# Patient Record
Sex: Female | Born: 1970 | Hispanic: Yes | Smoking: Never smoker
Health system: Southern US, Community
[De-identification: ages and names within clinical notes are randomized; demographics above are authoritative.]

## PROBLEM LIST (undated history)

## (undated) DIAGNOSIS — F319 Bipolar disorder, unspecified: Secondary | ICD-10-CM

## (undated) DIAGNOSIS — F209 Schizophrenia, unspecified: Secondary | ICD-10-CM

## (undated) DIAGNOSIS — I10 Essential (primary) hypertension: Secondary | ICD-10-CM

## (undated) DIAGNOSIS — Z5189 Encounter for other specified aftercare: Secondary | ICD-10-CM

## (undated) DIAGNOSIS — E119 Type 2 diabetes mellitus without complications: Secondary | ICD-10-CM

## (undated) DIAGNOSIS — O039 Complete or unspecified spontaneous abortion without complication: Secondary | ICD-10-CM

## (undated) DIAGNOSIS — K5792 Diverticulitis of intestine, part unspecified, without perforation or abscess without bleeding: Secondary | ICD-10-CM

## (undated) HISTORY — PX: TUBAL LIGATION: SHX77

## (undated) HISTORY — PX: TUMOR REMOVAL: SHX12

---

## 2018-12-29 ENCOUNTER — Other Ambulatory Visit: Payer: Self-pay

## 2018-12-29 ENCOUNTER — Encounter (HOSPITAL_COMMUNITY): Payer: Self-pay | Admitting: *Deleted

## 2018-12-29 ENCOUNTER — Emergency Department (HOSPITAL_COMMUNITY)
Admission: EM | Admit: 2018-12-29 | Discharge: 2018-12-29 | Disposition: A | Discharge status: Home / Self Care | Payer: Medicaid Other | Attending: Emergency Medicine | Admitting: Emergency Medicine

## 2018-12-29 DIAGNOSIS — R1031 Right lower quadrant pain: Secondary | ICD-10-CM | POA: Insufficient documentation

## 2018-12-29 DIAGNOSIS — Z5321 Procedure and treatment not carried out due to patient leaving prior to being seen by health care provider: Secondary | ICD-10-CM | POA: Insufficient documentation

## 2018-12-29 HISTORY — DX: Schizophrenia, unspecified: F20.9

## 2018-12-29 HISTORY — DX: Essential (primary) hypertension: I10

## 2018-12-29 HISTORY — DX: Bipolar disorder, unspecified: F31.9

## 2018-12-29 HISTORY — DX: Encounter for other specified aftercare: Z51.89

## 2018-12-29 HISTORY — DX: Type 2 diabetes mellitus without complications: E11.9

## 2018-12-29 HISTORY — DX: Complete or unspecified spontaneous abortion without complication: O03.9

## 2018-12-29 LAB — COMPREHENSIVE METABOLIC PANEL WITH GFR
ALT: 20 U/L (ref 0–44)
AST: 19 U/L (ref 15–41)
Albumin: 3.9 g/dL (ref 3.5–5.0)
Alkaline Phosphatase: 78 U/L (ref 38–126)
Anion gap: 12 (ref 5–15)
BUN: 10 mg/dL (ref 6–20)
CO2: 27 mmol/L (ref 22–32)
Calcium: 9.4 mg/dL (ref 8.9–10.3)
Chloride: 100 mmol/L (ref 98–111)
Creatinine, Ser: 0.75 mg/dL (ref 0.44–1.00)
GFR calc Af Amer: >60 mL/min
GFR calc non Af Amer: >60 mL/min
Glucose, Bld: 162 mg/dL — ABNORMAL HIGH (ref 70–99)
Potassium: 4.2 mmol/L (ref 3.5–5.1)
Sodium: 139 mmol/L (ref 135–145)
Total Bilirubin: 0.3 mg/dL (ref 0.3–1.2)
Total Protein: 7.8 g/dL (ref 6.5–8.1)

## 2018-12-29 LAB — CBC
HCT: 42.3 % (ref 36.0–46.0)
Hemoglobin: 13.2 g/dL (ref 12.0–15.0)
MCH: 29.7 pg (ref 26.0–34.0)
MCHC: 31.2 g/dL (ref 30.0–36.0)
MCV: 95.1 fL (ref 80.0–100.0)
Platelets: 352 K/uL (ref 150–400)
RBC: 4.45 MIL/uL (ref 3.87–5.11)
RDW: 14.7 % (ref 11.5–15.5)
WBC: 10.1 K/uL (ref 4.0–10.5)
nRBC: 0 % (ref 0.0–0.2)

## 2018-12-29 LAB — LIPASE, BLOOD: Lipase: 29 U/L (ref 11–51)

## 2018-12-29 NOTE — ED Triage Notes (Signed)
Pt c/o right sided abdominal pain that started last night. Denies n/v/d. Pt reports she had an xray done in [place] about a week ago and was told she had uterine cancer and needed to follow up with a doctor.

## 2019-01-18 ENCOUNTER — Telehealth: Payer: Self-pay | Admitting: Obstetrics and Gynecology

## 2019-01-18 NOTE — Telephone Encounter (Signed)
Called patient regarding appointment scheduled in our office and advised to come alone to the visits, however, a support person, over age 50, may accompany her  to appointment if assistance is needed for safety or care concerns. Otherwise, support persons should remain outside until the visit is complete.   We ask if you have had any exposure to anyone suspected or confirmed of having COVID-19, are awaiting test results for COVID-19 or if you are experiencing any of the following, to call and reschedule your appointment: fever, cough, shortness of breath, muscle pain, diarrhea, rash, vomiting, abdominal pain, red eye, weakness, bruising, bleeding, joint pain, or a severe headache.   Please know we will ask you these questions or similar questions when you arrive for your appointment and again it's how we are keeping everyone safe.    Also,to keep you safe, please use the provided hand sanitizer when you enter the office. We are asking everyone in the office to wear a mask to help prevent the spread of germs. If you have a mask of your own, please wear it to your appointment, if not, we are happy to provide one for you.  Thank you for understanding and your cooperation.    [facility] Staff    

## 2019-01-19 ENCOUNTER — Other Ambulatory Visit: Payer: Self-pay | Admitting: Obstetrics and Gynecology

## 2019-01-19 ENCOUNTER — Encounter: Payer: Self-pay | Admitting: Obstetrics and Gynecology

## 2019-01-19 ENCOUNTER — Other Ambulatory Visit: Payer: Self-pay

## 2019-01-19 ENCOUNTER — Ambulatory Visit (INDEPENDENT_AMBULATORY_CARE_PROVIDER_SITE_OTHER): Payer: Medicaid Other | Admitting: Obstetrics and Gynecology

## 2019-01-19 VITALS — BP 139/105 | HR 85 | Ht 63.0 in | Wt 229.4 lb

## 2019-01-19 DIAGNOSIS — N840 Polyp of corpus uteri: Secondary | ICD-10-CM

## 2019-01-19 DIAGNOSIS — N950 Postmenopausal bleeding: Secondary | ICD-10-CM

## 2019-01-19 NOTE — Progress Notes (Addendum)
Patient ID: Jaclyn Butler, female   DOB: [birth year], 48 y.o.   MRN: [id number]  Jaclyn Butler is a 48 y.o. female [place] presenting today for post-menopausal bleeding. She woke up one morning last month and her underwear was soaked with dark red blood. After discussing her options, the pt decided that she would like to proceed with an endometrial biopsy today.  Endometrial Biopsy: Patient given informed consent, signed copy in the chart, time out was performed. Time out taken. The patient was placed in the lithotomy position and the cervix brought into view with sterile speculum.  Portio of cervix cleansed x 2 with betadine swabs.  A tenaculum was placed in the anterior lip of the cervix. The uterus was sounded for depth of 9.5 cm,. Milex uterine Explora 3 mm was introduced to into the uterus, suction created,  and an endometrial sample was obtained. All equipment was removed and accounted for.   The patient tolerated the procedure well.   Patient given post procedure instructions.  Followup: with results by phone  By signing my name below, I, De Burrs, attest that this documentation has been prepared under the direction and in the presence of Jonnie Kind, MD. Electronically Signed: De Burrs, Medical Scribe. 01/19/19. 12:21 PM.  I personally performed the services described in this documentation, which was SCRIBED in my presence. The recorded information has been reviewed and considered accurate. It has been edited as necessary during review. Jonnie Kind, MD

## 2019-01-20 ENCOUNTER — Telehealth: Payer: Self-pay | Admitting: Obstetrics and Gynecology

## 2019-01-20 NOTE — Telephone Encounter (Signed)
PT MADE aware of endometrial biopsy showing benign polypoid tissue. Pt reassured. Pt to keep mensrual calendayr. If bleegin persisits, pt to be reviewing Endobmetrial ablation as an option.

## 2020-06-12 ENCOUNTER — Emergency Department (HOSPITAL_COMMUNITY)
Admission: EM | Admit: 2020-06-12 | Discharge: 2020-06-13 | Disposition: A | Discharge status: Home / Self Care | Payer: Medicaid Other | Attending: Emergency Medicine | Admitting: Emergency Medicine

## 2020-06-12 ENCOUNTER — Other Ambulatory Visit: Payer: Self-pay

## 2020-06-12 ENCOUNTER — Encounter (HOSPITAL_COMMUNITY): Payer: Self-pay | Admitting: *Deleted

## 2020-06-12 DIAGNOSIS — R0789 Other chest pain: Secondary | ICD-10-CM | POA: Insufficient documentation

## 2020-06-12 DIAGNOSIS — Z7984 Long term (current) use of oral hypoglycemic drugs: Secondary | ICD-10-CM | POA: Insufficient documentation

## 2020-06-12 DIAGNOSIS — Z79899 Other long term (current) drug therapy: Secondary | ICD-10-CM | POA: Insufficient documentation

## 2020-06-12 DIAGNOSIS — R202 Paresthesia of skin: Secondary | ICD-10-CM | POA: Insufficient documentation

## 2020-06-12 DIAGNOSIS — R519 Headache, unspecified: Secondary | ICD-10-CM

## 2020-06-12 DIAGNOSIS — F419 Anxiety disorder, unspecified: Secondary | ICD-10-CM | POA: Insufficient documentation

## 2020-06-12 DIAGNOSIS — I10 Essential (primary) hypertension: Secondary | ICD-10-CM | POA: Insufficient documentation

## 2020-06-12 DIAGNOSIS — E119 Type 2 diabetes mellitus without complications: Secondary | ICD-10-CM | POA: Insufficient documentation

## 2020-06-12 MED ORDER — SODIUM CHLORIDE 0.9 % IV BOLUS
1000.0000 mL | Freq: Once | INTRAVENOUS | Status: AC
  Administered 2020-06-12: 1000 mL via INTRAVENOUS

## 2020-06-12 MED ORDER — PROCHLORPERAZINE EDISYLATE 10 MG/2ML IJ SOLN
10.0000 mg | Freq: Once | INTRAMUSCULAR | Status: AC
  Administered 2020-06-12: 10 mg via INTRAVENOUS
  Filled 2020-06-12: qty 2

## 2020-06-12 MED ORDER — DIPHENHYDRAMINE HCL 50 MG/ML IJ SOLN
25.0000 mg | Freq: Once | INTRAMUSCULAR | Status: AC
  Administered 2020-06-12: 25 mg via INTRAVENOUS
  Filled 2020-06-12: qty 1

## 2020-06-12 NOTE — ED Triage Notes (Signed)
Pt with right face numbness, tingling to fingers of right hand and left sided CP since this morning.  Stabbing HA as well. Noted around 1000 this morning per pt.

## 2020-06-12 NOTE — ED Provider Notes (Signed)
[facility] EMERGENCY DEPARTMENT Provider Note   CSN: [id number] Arrival date & time: 06/12/20  2109     History Chief Complaint  Patient presents with  . Headache    Jaclyn Butler is a 50 y.o. female.  HPI   Patient is a 50 year old female, she reports a history of bipolar disorder schizophrenia and diabetes for which she takes a combination of Seroquel and metformin.  She denies tobacco or alcohol use.  She was in her usual state of health until this morning around 10:00 in the morning when she developed a gradual onset of a sharp and stabbing pain that is over the right temporal area.  This has not caused any visual changes, it seems to be intermittent, comes and goes, lasts for a few seconds and then goes away, it is associated with a feeling of anxiety, a discomfort in the left side of the chest and tingling in the right fingers.  She does not have any left-sided symptoms, she is not short of breath or coughing or having fevers chills nausea or vomiting.  She has not had any medications for this prior to arrival.  She does report a history of allergy to naproxen.  Past Medical History:  Diagnosis Date  . Bipolar disorder (HCC)   . Blood transfusion without reported diagnosis   . Diabetes mellitus without complication (HCC)   . Hypertension   . Miscarriage   . Schizophrenia (HCC)     There are no problems to display for this patient.   Past Surgical History:  Procedure Laterality Date  . TUBAL LIGATION    . 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 Cirrhosis Father   . Diabetes Mother   . Diabetes Sister   . Blindness Sister   . Hypertension Sister   . Diabetes Sister   . Diabetes Daughter   . Hypertension Daughter    . Obesity Daughter     Social History   Tobacco Use  . Smoking status: Never Smoker  . Smokeless tobacco: Never Used  Vaping Use  . Vaping Use: Never used  Substance Use Topics  . Alcohol use: Not Currently  . Drug use: Not Currently    Types: Cocaine    Comment: clean for several years    Home Medications Prior to Admission medications   Medication Sig Start Date End Date Taking? Authorizing Provider  atorvastatin (LIPITOR) 20 MG tablet Take 20 mg by mouth daily.    [provider]  metFORMIN (GLUCOPHAGE) 500 MG tablet Take 500 mg by mouth 2 (two) times daily with a meal.    [provider]  QUEtiapine (SEROQUEL) 200 MG tablet Take 200 mg by mouth at bedtime.    [provider]    Allergies    Naproxen  Review of Systems   Review of Systems  All other systems reviewed and are negative.   Physical Exam Updated Vital Signs BP 129/79  Pulse 81   Temp 98.4 F (36.9 C) (Oral)   Resp (!) 24   Ht 1.6 m (5\' 3" )   Wt 102.1 kg   SpO2 100%   BMI 39.86 kg/m   Physical Exam Vitals and nursing note reviewed.  Constitutional:      General: She is not in acute distress.    Appearance: She is well-developed.  HENT:     Head: Normocephalic and atraumatic.     Mouth/Throat:     Pharynx: No oropharyngeal exudate.  Eyes:     General: No scleral icterus.       Right eye: No discharge.        Left eye: No discharge.     Conjunctiva/sclera: Conjunctivae normal.     Pupils: Pupils are equal, round, and reactive to light.  Neck:     Thyroid: No thyromegaly.     Vascular: No JVD.  Cardiovascular:     Rate and Rhythm: Normal rate and regular rhythm.     Heart sounds: Normal heart sounds. No murmur heard. No friction rub. No gallop.   Pulmonary:     Effort: Pulmonary effort is normal. No respiratory distress.     Breath sounds: Normal breath sounds. No wheezing or rales.  Abdominal:     General: Bowel sounds are normal. There is no distension.      Palpations: Abdomen is soft. There is no mass.     Tenderness: There is no abdominal tenderness.  Musculoskeletal:        General: No tenderness. Normal range of motion.     Cervical back: Normal range of motion and neck supple.  Lymphadenopathy:     Cervical: No cervical adenopathy.  Skin:    General: Skin is warm and dry.     Findings: No erythema or rash.  Neurological:     Mental Status: She is alert.     Coordination: Coordination normal.     Comments: The patient is able to speak in full sentences, she has normal mental status, she is able to follow commands without any difficulty including finger-nose-finger, she has normal strength in all 4 extremities, cranial nerves III through XII are normal, speech is clear, mentation is clear, coordination is exact, gait is normal, facial symmetry is normal, straight leg raise is normal, sensation is normal except for slight tingling in her right hand.  Psychiatric:        Behavior: Behavior normal.     ED Results / Procedures / Treatments   Labs (all labs ordered are listed, but only abnormal results are displayed) Labs Reviewed - No data to display  EKG EKG Interpretation  Date/Time:  Tuesday Jun 12 2020 21:23:14 EDT Ventricular Rate:  88 PR Interval:  162 QRS Duration: 90 QT Interval:  384 QTC Calculation: 464 R Axis:   -68 Text Interpretation: Normal sinus rhythm Left axis deviation Cannot rule out Anterior infarct , age undetermined Abnormal ECG Confirmed by [id number] (Eber Hong) on 06/12/2020 9:43:13 PM   Radiology No results found.  Procedures Procedures   Medications Ordered in ED Medications  prochlorperazine (COMPAZINE) injection 10 mg (10 mg Intravenous Given 06/12/20 2332)  diphenhydrAMINE (BENADRYL) injection 25 mg (25 mg Intravenous Given 06/12/20 2332)  sodium chloride 0.9 % bolus 1,000 mL (1,000 mLs Intravenous New Bag/Given 06/12/20 2330)    ED Course  I have reviewed the triage vital signs and the  nursing notes.  Pertinent labs & imaging results that were available during my care of the  patient were reviewed by me and considered in my medical decision making (see chart for details).    MDM Rules/Calculators/A&P                          The patient overall appears well, vital signs suggest a slight hypertension at 141/88 but she is afebrile, her EKG is unremarkable and in fact I would say it is rather normal.  She does have a left axis deviation but no signs of ST elevation or depression, no signs of T wave abnormalities.  We will give her some medication for the headache which does not seem pathological, it is not constant, severe, it did not come on acutely, it is not the worst headache of her life and she is not anticoagulated.  She has a normal neurologic exam except for slight tingling in her right hand at this time  And change of shift care signed out to oncoming physician to follow-up results and disposition accordingly  Final Clinical Impression(s) / ED Diagnoses Final diagnoses:  None    Rx / DC Orders ED Discharge Orders    None       Eber Hong, MD 06/12/20 2337

## 2020-06-14 ENCOUNTER — Emergency Department (HOSPITAL_COMMUNITY): Payer: Self-pay

## 2020-06-14 ENCOUNTER — Encounter (HOSPITAL_COMMUNITY): Payer: Self-pay | Admitting: Emergency Medicine

## 2020-06-14 ENCOUNTER — Emergency Department (HOSPITAL_COMMUNITY)
Admission: EM | Admit: 2020-06-14 | Discharge: 2020-06-14 | Disposition: A | Discharge status: Home / Self Care | Payer: Self-pay | Attending: Emergency Medicine | Admitting: Emergency Medicine

## 2020-06-14 ENCOUNTER — Other Ambulatory Visit: Payer: Self-pay

## 2020-06-14 DIAGNOSIS — R519 Headache, unspecified: Secondary | ICD-10-CM | POA: Insufficient documentation

## 2020-06-14 DIAGNOSIS — Z20822 Contact with and (suspected) exposure to covid-19: Secondary | ICD-10-CM | POA: Insufficient documentation

## 2020-06-14 DIAGNOSIS — E119 Type 2 diabetes mellitus without complications: Secondary | ICD-10-CM | POA: Insufficient documentation

## 2020-06-14 DIAGNOSIS — Z7984 Long term (current) use of oral hypoglycemic drugs: Secondary | ICD-10-CM | POA: Insufficient documentation

## 2020-06-14 DIAGNOSIS — I10 Essential (primary) hypertension: Secondary | ICD-10-CM | POA: Insufficient documentation

## 2020-06-14 LAB — CBC WITH DIFFERENTIAL/PLATELET
Abs Immature Granulocytes: 0.03 10*3/uL (ref 0.00–0.07)
Basophils Absolute: 0 10*3/uL (ref 0.0–0.1)
Basophils Relative: 0 %
Eosinophils Absolute: 0.1 10*3/uL (ref 0.0–0.5)
Eosinophils Relative: 1 %
HCT: 40.8 % (ref 36.0–46.0)
Hemoglobin: 13 g/dL (ref 12.0–15.0)
Immature Granulocytes: 0 %
Lymphocytes Relative: 23 %
Lymphs Abs: 2.4 10*3/uL (ref 0.7–4.0)
MCH: 30.1 pg (ref 26.0–34.0)
MCHC: 31.9 g/dL (ref 30.0–36.0)
MCV: 94.4 fL (ref 80.0–100.0)
Monocytes Absolute: 0.5 10*3/uL (ref 0.1–1.0)
Monocytes Relative: 5 %
Neutro Abs: 7.7 10*3/uL (ref 1.7–7.7)
Neutrophils Relative %: 71 %
Platelets: 294 10*3/uL (ref 150–400)
RBC: 4.32 MIL/uL (ref 3.87–5.11)
RDW: 14.5 % (ref 11.5–15.5)
WBC: 10.8 10*3/uL — ABNORMAL HIGH (ref 4.0–10.5)
nRBC: 0 % (ref 0.0–0.2)

## 2020-06-14 LAB — BASIC METABOLIC PANEL
Anion gap: 5 (ref 5–15)
BUN: 15 mg/dL (ref 6–20)
CO2: 30 mmol/L (ref 22–32)
Calcium: 9.2 mg/dL (ref 8.9–10.3)
Chloride: 100 mmol/L (ref 98–111)
Creatinine, Ser: 0.75 mg/dL (ref 0.44–1.00)
GFR, Estimated: >60 mL/min (ref >60)
Glucose, Bld: 154 mg/dL — ABNORMAL HIGH (ref 70–99)
Potassium: 4.3 mmol/L (ref 3.5–5.1)
Sodium: 135 mmol/L (ref 135–145)

## 2020-06-14 LAB — MAGNESIUM: Magnesium: 1.6 mg/dL — ABNORMAL LOW (ref 1.7–2.4)

## 2020-06-14 LAB — URINALYSIS, ROUTINE W REFLEX MICROSCOPIC
Bilirubin Urine: NEGATIVE
Glucose, UA: NEGATIVE mg/dL
Hgb urine dipstick: NEGATIVE
Ketones, ur: NEGATIVE mg/dL
Leukocytes,Ua: NEGATIVE
Nitrite: NEGATIVE
Protein, ur: NEGATIVE mg/dL
Specific Gravity, Urine: 1.01 (ref 1.005–1.030)
pH: 6 (ref 5.0–8.0)

## 2020-06-14 LAB — SEDIMENTATION RATE: Sed Rate: 35 mm/hr — ABNORMAL HIGH (ref 0–22)

## 2020-06-14 LAB — CBG MONITORING, ED: Glucose-Capillary: 166 mg/dL — ABNORMAL HIGH (ref 70–99)

## 2020-06-14 LAB — C-REACTIVE PROTEIN: CRP: 1.2 mg/dL — ABNORMAL HIGH (ref <1.0)

## 2020-06-14 MED ORDER — DEXAMETHASONE SODIUM PHOSPHATE 10 MG/ML IJ SOLN
8.0000 mg | Freq: Once | INTRAMUSCULAR | Status: AC
  Administered 2020-06-14: 8 mg via INTRAVENOUS
  Filled 2020-06-14: qty 1

## 2020-06-14 MED ORDER — SODIUM CHLORIDE 0.9 % IV BOLUS
500.0000 mL | Freq: Once | INTRAVENOUS | Status: AC
  Administered 2020-06-14: 500 mL via INTRAVENOUS

## 2020-06-14 MED ORDER — METOCLOPRAMIDE HCL 5 MG/ML IJ SOLN
10.0000 mg | Freq: Once | INTRAMUSCULAR | Status: AC
  Administered 2020-06-14: 10 mg via INTRAVENOUS
  Filled 2020-06-14: qty 2

## 2020-06-14 MED ORDER — DEXAMETHASONE SODIUM PHOSPHATE 4 MG/ML IJ SOLN: 4.0000 mg | Freq: Once | INTRAMUSCULAR | Status: DC

## 2020-06-14 NOTE — ED Triage Notes (Signed)
Pt c/o "stabbing pains" to right side of head x 30 mins, reports was feeling clammy but that has subsided; pt reports hx bipolar, schizophrenia, depression and anxiety-she's unsure if it may be related

## 2020-06-14 NOTE — Discharge Instructions (Addendum)
There were very mild elevations in the ESR and CRP.  These are nonspecific, meaning they do not point to any one thing.  Have these retested through your primary care provider.  Similarly, your magnesium was slightly low.  Have this retested as well.  Headache Your lab results showed no other abnormalities that would require action.  For future headaches please try the following regimen: Antiinflammatory medications: Take 600 mg of ibuprofen every 6 hours or 440 mg (over the counter dose) to 500 mg (prescription dose) of naproxen every 12 hours.  Use this regimen until headache subsides for up to 3 days .  After this time, these medications may be used as needed for pain. Take these medications with food to avoid upset stomach. Choose only one of these medications, do not take them together. Acetaminophen: Should you continue to have additional pain while taking the ibuprofen or naproxen, you may add in acetaminophen (generic for Tylenol) as needed. Your daily total maximum amount of acetaminophen from all sources should be limited to 4046m/day for persons without liver problems, or 20071mday for those with liver problems.  Hydration: Have a goal of about a half liter of water every couple hours to stay well hydrated.   Sleep: Please be sure to get plenty of sleep with a goal of 8 hours per night. Having a regular bed time and bedtime routine can help with this.  Screens: Reduce the amount of time you are in front of screens.  Take about a 5-10-minute break every hour or every couple hours to give your eyes rest.  Do not use screens in dark rooms.  Glasses with a blue light filter may also help reduce eye fatigue.  Stress: Take steps to reduce stress as much as possible.   Follow up: Follow-up with your primary care provider on this issue.  May also need to follow-up with the neurologist for increased frequency of headaches.  Test Results for COVID-19 pending  You have a test pending for  COVID-19.  Results typically return within about 48 hours.  Be sure to check MyChart for updated results.  We recommend isolating yourself until results are received.  Patients who have symptoms consistent with COVID-19 should self isolated for: At least 3 days (72 hours) have passed since recovery, defined as resolution of fever without the use of fever reducing medications and improvement in respiratory symptoms (e.g., cough, shortness of breath), and At least 7 days have passed since symptoms first appeared.  If you have no symptoms, but your test returns positive, recommend isolating for at least 10 days.

## 2020-06-14 NOTE — ED Provider Notes (Signed)
[facility] EMERGENCY DEPARTMENT Provider Note   CSN: [id number] Arrival date & time: 06/14/20  1229     History Chief Complaint  Patient presents with  . Headache    Jaclyn Butler is a 50 y.o. female.  HPI      Jaclyn Butler is a 50 y.o. female, with a history of bipolar, DM, HTN, schizophrenia, presenting to the ED with headache beginning shortly prior to arrival. Patient states, "Something was happening with my sister and we had to call EMS to bring her to the emergency room.  I started to have a headache when I was worried about her."  States her pain is to the right side of the head, all the way across the right side, nonradiating from this location, initially 10/10, currently 7/10, sharp.  This pain is similar to her headache she experienced a couple days ago which brought her to the ED.  Denies changes in medications.  She states she has been compliant with her prescribed medications.  Denies anticoagulation. Denies fever, recent illness, fall/trauma, dizziness, vision changes, facial droop, speech deficit, numbness, weakness, neck stiffness, nausea/vomiting, or any other complaints.   Past Medical History:  Diagnosis Date  . Bipolar disorder ([place])   . Blood transfusion without reported diagnosis   . Diabetes mellitus without complication ([place])   . Hypertension   . Miscarriage   . Schizophrenia ([place])     There are no problems to display for this patient.   Past Surgical History:  Procedure Laterality Date  . TUBAL LIGATION    . 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 Cirrhosis Father   . Diabetes Mother   . Diabetes Sister   . Blindness Sister   . Hypertension Sister   . Diabetes Sister   . Diabetes  Daughter   . Hypertension Daughter   . Obesity Daughter     Social History   Tobacco Use  . Smoking status: Never Smoker  . Smokeless tobacco: Never Used  Vaping Use  . Vaping Use: Never used  Substance Use Topics  . Alcohol use: Not Currently  . Drug use: Not Currently    Types: Cocaine    Comment: clean for several years    Home Medications Prior to Admission medications   Medication Sig Start Date End Date Taking? Authorizing Provider  atorvastatin (LIPITOR) 20 MG tablet Take 20 mg by mouth daily.    [provider]  metFORMIN (GLUCOPHAGE) 500 MG tablet Take 500 mg by mouth 2 (two) times daily with a meal.    [provider]  QUEtiapine (SEROQUEL) 200 MG tablet Take 200 mg by mouth at bedtime.    [provider]    Allergies    Naproxen  Review of Systems   Review of Systems  Constitutional: Negative for chills, diaphoresis and fever.  Respiratory: Negative for shortness of breath.   Cardiovascular: Negative for chest pain.  Gastrointestinal: Negative for abdominal  pain, nausea and vomiting.  Musculoskeletal: Negative for neck stiffness.  Neurological: Positive for headaches. Negative for dizziness, seizures, syncope, facial asymmetry, speech difficulty, weakness, light-headedness and numbness.  All other systems reviewed and are negative.   Physical Exam Updated Vital Signs BP (!) 133/93 (BP Location: Left Arm)   Pulse 81   Temp 98.3 F (36.8 C) (Oral)   Resp 16   Ht 5' 3"  (1.6 m)   Wt 102.1 kg   SpO2 96%   BMI 39.86 kg/m   Physical Exam Vitals and nursing note reviewed.  Constitutional:      General: She is not in acute distress.    Appearance: She is well-developed. She is not diaphoretic.  HENT:     Head: Normocephalic and atraumatic.     Comments: No tenderness to the right side of the head, specifically no tenderness, swelling, or color change to the temple.    Mouth/Throat:     Mouth: Mucous membranes are moist.      Pharynx: Oropharynx is clear.  Eyes:     Conjunctiva/sclera: Conjunctivae normal.  Cardiovascular:     Rate and Rhythm: Normal rate and regular rhythm.     Pulses: Normal pulses.          Radial pulses are 2+ on the right side and 2+ on the left side.       Posterior tibial pulses are 2+ on the right side and 2+ on the left side.     Heart sounds: Normal heart sounds.     Comments: Tactile temperature in the extremities appropriate and equal bilaterally. Pulmonary:     Effort: Pulmonary effort is normal. No respiratory distress.     Breath sounds: Normal breath sounds.  Abdominal:     Palpations: Abdomen is soft.     Tenderness: There is no abdominal tenderness. There is no guarding.  Musculoskeletal:     Cervical back: Neck supple.     Right lower leg: No edema.     Left lower leg: No edema.  Lymphadenopathy:     Cervical: No cervical adenopathy.  Skin:    General: Skin is warm and dry.  Neurological:     Mental Status: She is alert.     Comments: No noted acute cognitive deficit. Sensation grossly intact to light touch in the extremities.   Grip strengths equal bilaterally.   Strength 5/5 in all extremities.  No gait disturbance.  Coordination intact.  Cranial nerves III-XII grossly intact.  Handles oral secretions without noted difficulty.  No noted phonation or speech deficit. No facial droop.   Psychiatric:        Mood and Affect: Mood and affect normal.        Speech: Speech normal.        Behavior: Behavior normal.     ED Results / Procedures / Treatments   Labs (all labs ordered are listed, but only abnormal results are displayed) Labs Reviewed  URINALYSIS, ROUTINE W REFLEX MICROSCOPIC - Abnormal; Notable for the following components:      Result Value   Color, Urine STRAW (*)    All other components within normal limits  BASIC METABOLIC PANEL - Abnormal; Notable for the following components:   Glucose, Bld 154 (*)    All other components within normal  limits  CBC WITH DIFFERENTIAL/PLATELET - Abnormal; Notable for the following components:   WBC 10.8 (*)    All other components within normal limits  MAGNESIUM - Abnormal; Notable for the following  components:   Magnesium 1.6 (*)    All other components within normal limits  C-REACTIVE PROTEIN - Abnormal; Notable for the following components:   CRP 1.2 (*)    All other components within normal limits  SEDIMENTATION RATE - Abnormal; Notable for the following components:   Sed Rate 35 (*)    All other components within normal limits  CBG MONITORING, ED - Abnormal; Notable for the following components:   Glucose-Capillary 166 (*)    All other components within normal limits  SARS CORONAVIRUS 2 (TAT 6-24 HRS)   WBC  Date Value Ref Range Status  06/14/2020 10.8 (H) 4.0 - 10.5 K/uL Final  12/29/2018 10.1 4.0 - 10.5 K/uL Final     EKG None  Radiology CT Head Wo Contrast  Result Date: 06/14/2020 CLINICAL DATA:  Right-sided headache. EXAM: CT HEAD WITHOUT CONTRAST TECHNIQUE: Contiguous axial images were obtained from the base of the skull through the vertex without intravenous contrast. COMPARISON:  None. FINDINGS: Brain: There is no evidence of an acute infarct, intracranial hemorrhage, mass, midline shift, or extra-axial fluid collection. The ventricles and sulci are normal. Vascular: No hyperdense vessel. Skull: No fracture or suspicious osseous lesion. Sinuses/Orbits: Visualized paranasal sinuses and mastoid air cells are clear. Unremarkable orbits. Other: None. IMPRESSION: Negative head CT. Electronically Signed   By: Logan Bores M.D.   On: 06/14/2020 15:11    Procedures Procedures   Medications Ordered in ED Medications  metoCLOPramide (REGLAN) injection 10 mg (10 mg Intravenous Given 06/14/20 1419)  sodium chloride 0.9 % bolus 500 mL (0 mLs Intravenous Stopped 06/14/20 1515)  dexamethasone (DECADRON) injection 8 mg (8 mg Intravenous Given 06/14/20 1419)    ED Course  I have  reviewed the triage vital signs and the nursing notes.  Pertinent labs & imaging results that were available during my care of the patient were reviewed by me and considered in my medical decision making (see chart for details).    MDM Rules/Calculators/A&P                          Patient presents with complaint of headache. No focal neurologic deficits. Patient is nontoxic appearing, afebrile, not tachycardic, not tachypneic, not hypotensive, maintains excellent SPO2 on room air, and is in no apparent distress.   I have reviewed the patient's chart to obtain more information.   I reviewed and interpreted the patient's labs and radiological studies. Very mild ESR and CRP elevations, nonspecific.  My suspicion for acute inflammatory process or, more specifically, pathology such as giant cell arteritis, is low on consideration of her age, onset, physical exam.  PCP follow-up. Very mild hypomagnesemia, discussed with the patient, she will follow-up on this with PCP as well.  The patient was given instructions for home care as well as return precautions. Patient voices understanding of these instructions, accepts the plan, and is comfortable with discharge.   Findings and plan of care discussed with attending physician, Noemi Chapel, MD.    Vitals:   06/14/20 1538 06/14/20 1545 06/14/20 1600 06/14/20 1615  BP: (!) 145/98 137/76 138/82 136/89  Pulse: 84 84 79 78  Resp: 16     Temp:      TempSrc:      SpO2: 99% (!) 84% 99% 97%  Weight:      Height:         Final Clinical Impression(s) / ED Diagnoses Final diagnoses:  Right-sided headache    Rx /  DC Orders ED Discharge Orders    None       Layla Maw 06/14/20 1702    Noemi Chapel, MD 06/15/20 [phone]

## 2020-06-15 LAB — SARS CORONAVIRUS 2 (TAT 6-24 HRS): SARS Coronavirus 2: NEGATIVE

## 2020-07-10 ENCOUNTER — Emergency Department (HOSPITAL_COMMUNITY)
Admission: EM | Admit: 2020-07-10 | Discharge: 2020-07-10 | Disposition: A | Discharge status: Home / Self Care | Payer: Medicaid - Out of State | Attending: Emergency Medicine | Admitting: Emergency Medicine

## 2020-07-10 ENCOUNTER — Other Ambulatory Visit: Payer: Self-pay

## 2020-07-10 ENCOUNTER — Encounter (HOSPITAL_COMMUNITY): Payer: Self-pay | Admitting: *Deleted

## 2020-07-10 DIAGNOSIS — E119 Type 2 diabetes mellitus without complications: Secondary | ICD-10-CM | POA: Diagnosis not present

## 2020-07-10 DIAGNOSIS — I10 Essential (primary) hypertension: Secondary | ICD-10-CM | POA: Insufficient documentation

## 2020-07-10 DIAGNOSIS — U071 COVID-19: Secondary | ICD-10-CM | POA: Insufficient documentation

## 2020-07-10 DIAGNOSIS — R519 Headache, unspecified: Secondary | ICD-10-CM | POA: Diagnosis present

## 2020-07-10 DIAGNOSIS — Z7984 Long term (current) use of oral hypoglycemic drugs: Secondary | ICD-10-CM | POA: Diagnosis not present

## 2020-07-10 LAB — CBC WITH DIFFERENTIAL/PLATELET
Abs Immature Granulocytes: 0.02 10*3/uL (ref 0.00–0.07)
Basophils Absolute: 0 10*3/uL (ref 0.0–0.1)
Basophils Relative: 0 %
Eosinophils Absolute: 0 10*3/uL (ref 0.0–0.5)
Eosinophils Relative: 0 %
HCT: 43.9 % (ref 36.0–46.0)
Hemoglobin: 14 g/dL (ref 12.0–15.0)
Immature Granulocytes: 0 %
Lymphocytes Relative: 22 %
Lymphs Abs: 1.6 10*3/uL (ref 0.7–4.0)
MCH: 29.8 pg (ref 26.0–34.0)
MCHC: 31.9 g/dL (ref 30.0–36.0)
MCV: 93.4 fL (ref 80.0–100.0)
Monocytes Absolute: 0.6 10*3/uL (ref 0.1–1.0)
Monocytes Relative: 8 %
Neutro Abs: 5.1 10*3/uL (ref 1.7–7.7)
Neutrophils Relative %: 70 %
Platelets: 312 10*3/uL (ref 150–400)
RBC: 4.7 MIL/uL (ref 3.87–5.11)
RDW: 14.6 % (ref 11.5–15.5)
WBC: 7.3 10*3/uL (ref 4.0–10.5)
nRBC: 0 % (ref 0.0–0.2)

## 2020-07-10 LAB — LACTIC ACID, PLASMA: Lactic Acid, Venous: 1.2 mmol/L (ref 0.5–1.9)

## 2020-07-10 LAB — BASIC METABOLIC PANEL
Anion gap: 9 (ref 5–15)
BUN: 8 mg/dL (ref 6–20)
CO2: 24 mmol/L (ref 22–32)
Calcium: 8.6 mg/dL — ABNORMAL LOW (ref 8.9–10.3)
Chloride: 103 mmol/L (ref 98–111)
Creatinine, Ser: 0.89 mg/dL (ref 0.44–1.00)
GFR, Estimated: >60 mL/min (ref >60)
Glucose, Bld: 110 mg/dL — ABNORMAL HIGH (ref 70–99)
Potassium: 3.7 mmol/L (ref 3.5–5.1)
Sodium: 136 mmol/L (ref 135–145)

## 2020-07-10 LAB — URINALYSIS, ROUTINE W REFLEX MICROSCOPIC
Bilirubin Urine: NEGATIVE
Glucose, UA: NEGATIVE mg/dL
Hgb urine dipstick: NEGATIVE
Ketones, ur: NEGATIVE mg/dL
Leukocytes,Ua: NEGATIVE
Nitrite: NEGATIVE
Protein, ur: NEGATIVE mg/dL
Specific Gravity, Urine: 1.008 (ref 1.005–1.030)
pH: 6 (ref 5.0–8.0)

## 2020-07-10 LAB — RESP PANEL BY RT-PCR (FLU A&B, COVID) ARPGX2
Influenza A by PCR: NEGATIVE
Influenza B by PCR: NEGATIVE
SARS Coronavirus 2 by RT PCR: POSITIVE — AB

## 2020-07-10 MED ORDER — PROCHLORPERAZINE EDISYLATE 10 MG/2ML IJ SOLN
10.0000 mg | Freq: Once | INTRAMUSCULAR | Status: AC
  Administered 2020-07-10: 10 mg via INTRAVENOUS
  Filled 2020-07-10: qty 2

## 2020-07-10 MED ORDER — DEXAMETHASONE SODIUM PHOSPHATE 10 MG/ML IJ SOLN
10.0000 mg | Freq: Once | INTRAMUSCULAR | Status: AC
  Administered 2020-07-10: 10 mg via INTRAVENOUS
  Filled 2020-07-10: qty 1

## 2020-07-10 MED ORDER — SODIUM CHLORIDE 0.9 % IV BOLUS
1000.0000 mL | Freq: Once | INTRAVENOUS | Status: AC
  Administered 2020-07-10: 1000 mL via INTRAVENOUS

## 2020-07-10 MED ORDER — MOLNUPIRAVIR 200 MG PO CAPS: 800.0000 mg | ORAL_CAPSULE | Freq: Two times a day (BID) | ORAL | 0 refills | Status: AC

## 2020-07-10 MED ORDER — DIPHENHYDRAMINE HCL 50 MG/ML IJ SOLN
12.5000 mg | Freq: Once | INTRAMUSCULAR | Status: AC
  Administered 2020-07-10: 12.5 mg via INTRAVENOUS
  Filled 2020-07-10: qty 1

## 2020-07-10 MED ORDER — ACETAMINOPHEN 500 MG PO TABS: 1000.0000 mg | ORAL_TABLET | Freq: Once | ORAL | Status: DC

## 2020-07-10 NOTE — ED Provider Notes (Signed)
[facility] EMERGENCY DEPARTMENT Provider Note   CSN: [id number] Arrival date & time: 07/10/20  1626     History Chief Complaint  Patient presents with   Headache    Jaclyn Butler is a 50 y.o. female with a history of diabetes, hypertension, schizophrenia and bipolar disorder presenting for evaluation of a headache and left-sided myalgias and achiness which started 2 days ago.  Her body aches are isolated to her left side including her arm flank and left leg to her knee.  She has been taking Tylenol without relief of her symptoms.  She denies nausea or vomiting.  She denies any vision changes and photophobia.  She was seen here last month for headache on 2 separate occasions, underwent CT imaging on her first visit here which she was a negative study.  She does not have a history of migraine headaches.  She denies focal weakness, vision changes, neck pain or stiffness.  No dizziness.  She has taken Tylenol without symptom relief.  She was not aware of any fever but her temperature here upon arrival was 99.7.  The history is provided by the patient.      Past Medical History:  Diagnosis Date   Bipolar disorder (HCC)    Blood transfusion without reported diagnosis    Diabetes mellitus without complication (HCC)    Hypertension    Miscarriage    Schizophrenia (HCC)     There are no problems to display for this patient.   Past Surgical History:  Procedure Laterality Date   TUBAL LIGATION     TUMOR REMOVAL     from lower bowel when she was 11 years ago     OB History     Gravida  7   Para  6   Term  6   Preterm      AB  1   Living  6      SAB  1   IAB      Ectopic      Multiple      Live Births  6           Family History  Problem Relation Age of Onset   Cirrhosis Father    Diabetes Mother    Diabetes Sister    Blindness Sister    Hypertension Sister    Diabetes Sister    Diabetes Daughter    Hypertension Daughter    Obesity Daughter      Social History   Tobacco Use   Smoking status: Never   Smokeless tobacco: Never  Vaping Use   Vaping Use: Never used  Substance Use Topics   Alcohol use: Not Currently   Drug use: Not Currently    Types: Cocaine    Comment: clean for several years    Home Medications Prior to Admission medications   Medication Sig Start Date End Date Taking? Authorizing Provider  metFORMIN (GLUCOPHAGE) 500 MG tablet Take 500 mg by mouth 2 (two) times daily with a meal.   Yes [provider]  Molnupiravir 200 MG CAPS Take 4 capsules (800 mg total) by mouth 2 times daily at 12 noon and 4 pm for 5 days. 07/10/20 07/15/20 Yes Zyah Gomm, Raynelle Fanning, PA-C  QUEtiapine (SEROQUEL) 200 MG tablet Take 200 mg by mouth at bedtime.   Yes [provider]  sertraline (ZOLOFT) 50 MG tablet Take 50 mg by mouth daily.   Yes [provider]    Allergies    Naproxen  Review of  Systems   Review of Systems  Constitutional:  Negative for chills and fever.  HENT:  Negative for congestion and sore throat.   Eyes: Negative.   Respiratory:  Negative for chest tightness and shortness of breath.   Cardiovascular:  Negative for chest pain.  Gastrointestinal:  Negative for abdominal pain, nausea and vomiting.  Genitourinary: Negative.   Musculoskeletal:  Positive for myalgias. Negative for arthralgias, joint swelling and neck pain.  Skin: Negative.  Negative for rash and wound.  Neurological:  Positive for headaches. Negative for dizziness, weakness, light-headedness and numbness.  Psychiatric/Behavioral: Negative.    All other systems reviewed and are negative.  Physical Exam Updated Vital Signs BP 118/77   Pulse 88   Temp 99.7 F (37.6 C) (Oral)   Resp (!) 21   Ht 5\' 3"  (1.6 m)   Wt 102.5 kg   SpO2 91%   BMI 40.03 kg/m   Physical Exam Vitals and nursing note reviewed.  Constitutional:      Appearance: She is well-developed.     Comments: Uncomfortable appearing  HENT:     Head:  Normocephalic and atraumatic.  Eyes:     Conjunctiva/sclera: Conjunctivae normal.     Pupils: Pupils are equal, round, and reactive to light.  Cardiovascular:     Rate and Rhythm: Normal rate and regular rhythm.     Heart sounds: Normal heart sounds.  Pulmonary:     Effort: Pulmonary effort is normal.     Breath sounds: Normal breath sounds. No wheezing.  Abdominal:     General: Bowel sounds are normal.     Palpations: Abdomen is soft.     Tenderness: There is no abdominal tenderness.  Musculoskeletal:        General: Normal range of motion.     Cervical back: Normal range of motion and neck supple.  Lymphadenopathy:     Cervical: No cervical adenopathy.  Skin:    General: Skin is warm and dry.     Findings: No rash.  Neurological:     Mental Status: She is alert and oriented to person, place, and time.     GCS: GCS eye subscore is 4. GCS verbal subscore is 5. GCS motor subscore is 6.     Sensory: No sensory deficit.     Gait: Gait normal.     Comments: Normal heel-shin, normal rapid alternating movements. Cranial nerves III-XII intact.  No pronator drift.  Psychiatric:        Speech: Speech normal.        Behavior: Behavior normal.        Thought Content: Thought content normal.    ED Results / Procedures / Treatments   Labs (all labs ordered are listed, but only abnormal results are displayed) Labs Reviewed  RESP PANEL BY RT-PCR (FLU A&B, COVID) ARPGX2 - Abnormal; Notable for the following components:      Result Value   SARS Coronavirus 2 by RT PCR POSITIVE (*)    All other components within normal limits  BASIC METABOLIC PANEL - Abnormal; Notable for the following components:   Glucose, Bld 110 (*)    Calcium 8.6 (*)    All other components within normal limits  CBC WITH DIFFERENTIAL/PLATELET  URINALYSIS, ROUTINE W REFLEX MICROSCOPIC  LACTIC ACID, PLASMA    EKG None  Radiology No results found.  Procedures Procedures   Medications Ordered in  ED Medications  acetaminophen (TYLENOL) tablet 1,000 mg (has no administration in time range)  sodium chloride  0.9 % bolus 1,000 mL (0 mLs Intravenous Stopped 07/10/20 2202)  prochlorperazine (COMPAZINE) injection 10 mg (10 mg Intravenous Given 07/10/20 1958)  diphenhydrAMINE (BENADRYL) injection 12.5 mg (12.5 mg Intravenous Given 07/10/20 1957)  dexamethasone (DECADRON) injection 10 mg (10 mg Intravenous Given 07/10/20 1956)    ED Course  I have reviewed the triage vital signs and the nursing notes.  Pertinent labs & imaging results that were available during my care of the patient were reviewed by me and considered in my medical decision making (see chart for details).    MDM Rules/Calculators/A&P                          Labs reviewed and discussed with patient.  She is positive for COVID-19.  I cannot explain why her myalgias are only left-sided, however her exam is fairly benign at this time.  She has no respiratory distress.  She was given IV fluids and given a headache migraine cocktail.  Her headache persisted but was improved at time of discharge.  She was given a dose of Tylenol prior to discharge home.  We discussed her home treatment plan including rest, increase fluid intake.  Tylenol for myalgias.  She was also prescribed molnupiravir and discussed the reasoning behind this medication.  She is within the 5-day window of symptom onset, she has a risk factor of obesity  and would benefit from this antiviral.  She is fully vaccinated for COVID which is reassuring.  She was given strict return precautions for any worsening symptoms.  She has no complaints of shortness of breath.  We also discussed home quarantine including her family members.  Lou Irigoyen was evaluated in Emergency Department on 07/11/2020 for the symptoms described in the history of present illness. She was evaluated in the context of the global COVID-19 pandemic, which necessitated consideration that the patient might be  at risk for infection with the SARS-CoV-2 virus that causes COVID-19. Institutional protocols and algorithms that pertain to the evaluation of patients at risk for COVID-19 are in a state of rapid change based on information released by regulatory bodies including the CDC and federal and state organizations. These policies and algorithms were followed during the patient's care in the ED.  Final Clinical Impression(s) / ED Diagnoses Final diagnoses:  COVID-19    Rx / DC Orders ED Discharge Orders          Ordered    Molnupiravir 200 MG CAPS  2 times daily        07/10/20 2156             Burgess Amor, PA-C 07/11/20 0000    Blane Ohara, MD 07/11/20 0010

## 2020-07-10 NOTE — ED Triage Notes (Signed)
Pt with c/o HA and left sided achy pain x 2 days.  Denies any N/V/D.  Has been taking tylenol at home without relief.

## 2020-07-10 NOTE — Discharge Instructions (Addendum)
You will need to stay home under quarantine for the next 5 days as your family members should as well.  If your symptoms are better and you are not running any fevers you may return to normal activities on Saturday since your symptoms started yesterday.  Rest make sure you are drinking plenty of fluids.  I recommend Tylenol or Motrin for body aches and fever.  You may take the medication prescribed if you choose to, this is an antiviral medication which may help you get over the COVID infection easier.

## 2020-07-10 NOTE — ED Notes (Signed)
Date and time results received: 07/10/20 2136 (use smartphrase ".now" to insert current time)  Test: Covid Critical Value: positive  Name of Provider Notified: Idol, PA  Orders Received? Or Actions Taken?: Actions Taken: no orders received

## 2020-10-04 ENCOUNTER — Encounter (HOSPITAL_COMMUNITY): Payer: Self-pay

## 2020-10-04 ENCOUNTER — Other Ambulatory Visit: Payer: Self-pay

## 2020-10-04 ENCOUNTER — Emergency Department (HOSPITAL_COMMUNITY)
Admission: EM | Admit: 2020-10-04 | Discharge: 2020-10-04 | Disposition: A | Discharge status: Home / Self Care | Payer: Medicaid - Out of State | Attending: Emergency Medicine | Admitting: Emergency Medicine

## 2020-10-04 ENCOUNTER — Emergency Department (HOSPITAL_COMMUNITY): Payer: Medicaid - Out of State

## 2020-10-04 DIAGNOSIS — R109 Unspecified abdominal pain: Secondary | ICD-10-CM | POA: Insufficient documentation

## 2020-10-04 DIAGNOSIS — R0781 Pleurodynia: Secondary | ICD-10-CM | POA: Diagnosis not present

## 2020-10-04 DIAGNOSIS — R0789 Other chest pain: Secondary | ICD-10-CM

## 2020-10-04 DIAGNOSIS — M549 Dorsalgia, unspecified: Secondary | ICD-10-CM | POA: Insufficient documentation

## 2020-10-04 DIAGNOSIS — I10 Essential (primary) hypertension: Secondary | ICD-10-CM | POA: Insufficient documentation

## 2020-10-04 DIAGNOSIS — Z7984 Long term (current) use of oral hypoglycemic drugs: Secondary | ICD-10-CM | POA: Diagnosis not present

## 2020-10-04 DIAGNOSIS — R079 Chest pain, unspecified: Secondary | ICD-10-CM | POA: Diagnosis present

## 2020-10-04 DIAGNOSIS — E119 Type 2 diabetes mellitus without complications: Secondary | ICD-10-CM | POA: Insufficient documentation

## 2020-10-04 LAB — COMPREHENSIVE METABOLIC PANEL
ALT: 23 U/L (ref 0–44)
AST: 22 U/L (ref 15–41)
Albumin: 4 g/dL (ref 3.5–5.0)
Alkaline Phosphatase: 82 U/L (ref 38–126)
Anion gap: 8 (ref 5–15)
BUN: 11 mg/dL (ref 6–20)
CO2: 24 mmol/L (ref 22–32)
Calcium: 8.4 mg/dL — ABNORMAL LOW (ref 8.9–10.3)
Chloride: 105 mmol/L (ref 98–111)
Creatinine, Ser: 0.83 mg/dL (ref 0.44–1.00)
GFR, Estimated: >60 mL/min (ref >60)
Glucose, Bld: 165 mg/dL — ABNORMAL HIGH (ref 70–99)
Potassium: 3.8 mmol/L (ref 3.5–5.1)
Sodium: 137 mmol/L (ref 135–145)
Total Bilirubin: 0.5 mg/dL (ref 0.3–1.2)
Total Protein: 8 g/dL (ref 6.5–8.1)

## 2020-10-04 LAB — URINALYSIS, ROUTINE W REFLEX MICROSCOPIC
Bilirubin Urine: NEGATIVE
Glucose, UA: NEGATIVE mg/dL
Hgb urine dipstick: NEGATIVE
Ketones, ur: NEGATIVE mg/dL
Leukocytes,Ua: NEGATIVE
Nitrite: NEGATIVE
Protein, ur: NEGATIVE mg/dL
Specific Gravity, Urine: 1.015 (ref 1.005–1.030)
pH: 7 (ref 5.0–8.0)

## 2020-10-04 LAB — CBC WITH DIFFERENTIAL/PLATELET
Abs Immature Granulocytes: 0.02 10*3/uL (ref 0.00–0.07)
Basophils Absolute: 0 10*3/uL (ref 0.0–0.1)
Basophils Relative: 0 %
Eosinophils Absolute: 0.1 10*3/uL (ref 0.0–0.5)
Eosinophils Relative: 2 %
HCT: 43 % (ref 36.0–46.0)
Hemoglobin: 13.3 g/dL (ref 12.0–15.0)
Immature Granulocytes: 0 %
Lymphocytes Relative: 35 %
Lymphs Abs: 2.5 10*3/uL (ref 0.7–4.0)
MCH: 29.9 pg (ref 26.0–34.0)
MCHC: 30.9 g/dL (ref 30.0–36.0)
MCV: 96.6 fL (ref 80.0–100.0)
Monocytes Absolute: 0.5 10*3/uL (ref 0.1–1.0)
Monocytes Relative: 7 %
Neutro Abs: 3.9 10*3/uL (ref 1.7–7.7)
Neutrophils Relative %: 56 %
Platelets: 275 10*3/uL (ref 150–400)
RBC: 4.45 MIL/uL (ref 3.87–5.11)
RDW: 14.6 % (ref 11.5–15.5)
WBC: 7.1 10*3/uL (ref 4.0–10.5)
nRBC: 0 % (ref 0.0–0.2)

## 2020-10-04 LAB — LIPASE, BLOOD: Lipase: 31 U/L (ref 11–51)

## 2020-10-04 LAB — D-DIMER, QUANTITATIVE: D-Dimer, Quant: 0.42 ug/mL-FEU (ref 0.00–0.50)

## 2020-10-04 MED ORDER — MORPHINE SULFATE (PF) 4 MG/ML IV SOLN
4.0000 mg | Freq: Once | INTRAVENOUS | Status: AC
Start: 2020-10-04 — End: 2020-10-04
  Administered 2020-10-04: 4 mg via INTRAVENOUS
  Filled 2020-10-04: qty 1

## 2020-10-04 MED ORDER — SODIUM CHLORIDE 0.9 % IV BOLUS
1000.0000 mL | Freq: Once | INTRAVENOUS | Status: AC
  Administered 2020-10-04: 1000 mL via INTRAVENOUS

## 2020-10-04 MED ORDER — TRAMADOL HCL 50 MG PO TABS: 50.0000 mg | ORAL_TABLET | Freq: Four times a day (QID) | ORAL | 0 refills | Status: DC | PRN

## 2020-10-04 MED ORDER — DEXAMETHASONE SODIUM PHOSPHATE 10 MG/ML IJ SOLN
10.0000 mg | Freq: Once | INTRAMUSCULAR | Status: AC
  Administered 2020-10-04: 10 mg via INTRAVENOUS
  Filled 2020-10-04: qty 1

## 2020-10-04 MED ORDER — ONDANSETRON HCL 4 MG/2ML IJ SOLN
4.0000 mg | Freq: Once | INTRAMUSCULAR | Status: AC
Start: 2020-10-04 — End: 2020-10-04
  Administered 2020-10-04: 4 mg via INTRAVENOUS
  Filled 2020-10-04: qty 2

## 2020-10-04 MED ORDER — PREDNISONE 10 MG PO TABS: ORAL_TABLET | ORAL | 0 refills | Status: DC

## 2020-10-04 NOTE — Discharge Instructions (Addendum)
Your exam and your tests today are reassuring.  Your symptoms suggest that you have a strain of either your ribs, cartilage or the muscles of your chest wall which can be very painful.  Use the medications prescribed.  I also recommend a heating pad applied to your chest for 20 minutes several times daily.  See the suggestions for obtaining a primary medical doctor.

## 2020-10-04 NOTE — ED Provider Notes (Signed)
[facility] EMERGENCY DEPARTMENT Provider Note   CSN: [id number] Arrival date & time: 10/04/20  4818     History Chief Complaint  Patient presents with   Back Pain    Jaclyn Butler is a 50 y.o. female with a history of hypertension and type 2 diabetes presenting for evaluation of left-sided lateral chest and pain which has been present for the past 2 weeks.  She describes a stabbing sensation throughout the entirety of her left chest and back which at times is worsened with movement and positional changes, but also can occur at rest.  She also endorses pain with deep inspiration but denies shortness of breath.  She has had no fevers or chills, denies nausea, vomiting, abdominal pain.  She does have problems with constipation but has been having a daily bowel movement.  She also has a history of diverticulitis, denies history of pancreatitis.  No history of kidney stones and denies any dysuria or hematuria.  She also denies cough, fever, chest or rib trauma.  She has had no leg edema or pain.  She has taken Tylenol without symptom improvement.  She states her blood glucose levels have been stable.  She denies any exacerbation of symptoms with p.o. intake.  The history is provided by the patient.      Past Medical History:  Diagnosis Date   Bipolar disorder (HCC)    Blood transfusion without reported diagnosis    Diabetes mellitus without complication (HCC)    Hypertension    Miscarriage    Schizophrenia (HCC)     There are no problems to display for this patient.   Past Surgical History:  Procedure Laterality Date   TUBAL LIGATION     TUMOR REMOVAL     from lower bowel when she was 11 years ago     OB History     Gravida  7   Para  6   Term  6   Preterm      AB  1   Living  6      SAB  1   IAB      Ectopic      Multiple      Live Births  6           Family History  Problem Relation Age of Onset   Cirrhosis Father    Diabetes Mother    Diabetes  Sister    Blindness Sister    Hypertension Sister    Diabetes Sister    Diabetes Daughter    Hypertension Daughter    Obesity Daughter     Social History   Tobacco Use   Smoking status: Never   Smokeless tobacco: Never  Vaping Use   Vaping Use: Never used  Substance Use Topics   Alcohol use: Not Currently   Drug use: Not Currently    Types: Cocaine    Comment: clean for several years    Home Medications Prior to Admission medications   Medication Sig Start Date End Date Taking? Authorizing Provider  predniSONE (DELTASONE) 10 MG tablet 6, 5, 4, 3, 2 then 1 tablet by mouth daily for 6 days total. 10/04/20  Yes Neng Albee, Raynelle Fanning, PA-C  traMADol (ULTRAM) 50 MG tablet Take 1 tablet (50 mg total) by mouth every 6 (six) hours as needed. 10/04/20  Yes Maverick Patman, Raynelle Fanning, PA-C  metFORMIN (GLUCOPHAGE) 500 MG tablet Take 500 mg by mouth 2 (two) times daily with a meal.    [provider]  QUEtiapine (SEROQUEL) 200 MG tablet Take 200 mg by mouth at bedtime.    [provider]  sertraline (ZOLOFT) 50 MG tablet Take 50 mg by mouth daily.    [provider]    Allergies    Naproxen  Review of Systems   Review of Systems  Constitutional:  Negative for appetite change, chills and fever.  HENT:  Negative for congestion and sore throat.   Eyes: Negative.   Respiratory:  Negative for cough, chest tightness and shortness of breath.   Cardiovascular:  Positive for chest pain. Negative for palpitations and leg swelling.  Gastrointestinal:  Negative for abdominal pain, nausea and vomiting.  Genitourinary: Negative.  Negative for dysuria and hematuria.  Musculoskeletal:  Positive for back pain. Negative for arthralgias, joint swelling and neck pain.  Skin: Negative.  Negative for rash and wound.  Neurological:  Negative for dizziness, weakness, light-headedness, numbness and headaches.  Psychiatric/Behavioral: Negative.    All other systems reviewed and are negative.  Physical  Exam Updated Vital Signs BP 128/78 (BP Location: Right Arm)   Pulse 77   Temp 98 F (36.7 C) (Oral)   Resp 16   Ht 5\' 2"  (1.575 m)   Wt 104.3 kg   SpO2 100%   BMI 42.07 kg/m   Physical Exam Vitals and nursing note reviewed.  Constitutional:      Appearance: She is well-developed.  HENT:     Head: Normocephalic and atraumatic.  Eyes:     Conjunctiva/sclera: Conjunctivae normal.  Cardiovascular:     Rate and Rhythm: Normal rate and regular rhythm.     Heart sounds: Normal heart sounds.  Pulmonary:     Effort: Pulmonary effort is normal.     Breath sounds: Normal breath sounds. No wheezing.  Chest:     Chest wall: Tenderness present.     Comments: Mild soreness to palpation along the left lateral mid chest wall without deformity, crepitus.  She has no CVA tenderness and no midline thoracic or lumbar tenderness. Abdominal:     General: Bowel sounds are normal.     Palpations: Abdomen is soft.     Tenderness: There is no abdominal tenderness. There is no guarding.  Musculoskeletal:        General: Normal range of motion.     Cervical back: Normal range of motion.  Skin:    General: Skin is warm and dry.     Findings: No rash.  Neurological:     Mental Status: She is alert.    ED Results / Procedures / Treatments   Labs (all labs ordered are listed, but only abnormal results are displayed) Labs Reviewed  COMPREHENSIVE METABOLIC PANEL - Abnormal; Notable for the following components:      Result Value   Glucose, Bld 165 (*)    Calcium 8.4 (*)    All other components within normal limits  URINALYSIS, ROUTINE W REFLEX MICROSCOPIC  CBC WITH DIFFERENTIAL/PLATELET  LIPASE, BLOOD  D-DIMER, QUANTITATIVE    EKG EKG Interpretation  Date/Time:  Thursday October 04 2020 11:36:03 EDT Ventricular Rate:  82 PR Interval:  154 QRS Duration: 90 QT Interval:  416 QTC Calculation: 486 R Axis:   -88 Text Interpretation: Normal sinus rhythm Left axis deviation Nonspecific T  wave abnormality Prolonged QT Abnormal ECG No significant change since last tracing Confirmed by [id number] [phone]) on 10/04/2020 12:03:17 PM  Radiology DG Chest Port 1 View  Result Date: 10/04/2020 CLINICAL DATA:  Chest pain. EXAM: PORTABLE CHEST  1 VIEW COMPARISON:  None. FINDINGS: The heart size and mediastinal contours are within normal limits. Both lungs are clear. The visualized skeletal structures are unremarkable. IMPRESSION: No active disease. Electronically Signed   By: Lupita Raider M.D.   On: 10/04/2020 12:02    Procedures Procedures   Medications Ordered in ED Medications  sodium chloride 0.9 % bolus 1,000 mL (0 mLs Intravenous Stopped 10/04/20 1448)  dexamethasone (DECADRON) injection 10 mg (10 mg Intravenous Given 10/04/20 1341)  morphine 4 MG/ML injection 4 mg (4 mg Intravenous Given 10/04/20 1448)  ondansetron (ZOFRAN) injection 4 mg (4 mg Intravenous Given 10/04/20 1447)    ED Course  I have reviewed the triage vital signs and the nursing notes.  Pertinent labs & imaging results that were available during my care of the patient were reviewed by me and considered in my medical decision making (see chart for details).    MDM Rules/Calculators/A&P                           Patient with sharp pain of 2 weeks duration left lateral chest and flank.  It is reproducible, pleuritic suggesting that this is a musculoskeletal source of symptoms.  Her labs and imaging were reviewed, she has a negative D-dimer, doubt PE.  EKG is stable, chest x-ray is clear.  No pneumonia.  Given some concern for allergy to NSAIDs specifically Naprosyn she was placed onPrednisone for inflammation.  Also given a small quantity of tramadol.  We discussed heat therapy to her chest wall several times daily.  As needed follow-up anticipated.  She was given referrals for establishment of primary care as [place]    Final Clinical Impression(s) / ED Diagnoses Final  diagnoses:  Chest wall pain    Rx / DC Orders ED Discharge Orders          Ordered    predniSONE (DELTASONE) 10 MG tablet        10/04/20 1551    traMADol (ULTRAM) 50 MG tablet  Every 6 hours PRN        10/04/20 1551             Burgess Amor, Cordelia Poche 10/04/20 1930    Jacalyn Lefevre, MD 10/09/20 1501

## 2020-10-04 NOTE — ED Notes (Signed)
PCXR done

## 2020-10-04 NOTE — ED Triage Notes (Signed)
Pt to er, pt states that she is here for some back pain for the past two week, states that it is under her L rib/flank/back, states that it is worse with movement sometimes, states that it feels like someone is stabbing her.

## 2020-10-16 ENCOUNTER — Emergency Department (HOSPITAL_COMMUNITY)
Admission: EM | Admit: 2020-10-16 | Discharge: 2020-10-16 | Disposition: A | Discharge status: Home / Self Care | Payer: Medicaid - Out of State | Attending: Emergency Medicine | Admitting: Emergency Medicine

## 2020-10-16 ENCOUNTER — Encounter (HOSPITAL_COMMUNITY): Payer: Self-pay | Admitting: Emergency Medicine

## 2020-10-16 ENCOUNTER — Other Ambulatory Visit: Payer: Self-pay

## 2020-10-16 DIAGNOSIS — Z7984 Long term (current) use of oral hypoglycemic drugs: Secondary | ICD-10-CM | POA: Diagnosis not present

## 2020-10-16 DIAGNOSIS — M546 Pain in thoracic spine: Secondary | ICD-10-CM | POA: Diagnosis present

## 2020-10-16 DIAGNOSIS — M7918 Myalgia, other site: Secondary | ICD-10-CM

## 2020-10-16 DIAGNOSIS — E119 Type 2 diabetes mellitus without complications: Secondary | ICD-10-CM | POA: Diagnosis not present

## 2020-10-16 DIAGNOSIS — I10 Essential (primary) hypertension: Secondary | ICD-10-CM | POA: Diagnosis not present

## 2020-10-16 MED ORDER — HYDROCODONE-ACETAMINOPHEN 5-325 MG PO TABS: 1.0000 | ORAL_TABLET | Freq: Four times a day (QID) | ORAL | 0 refills | Status: DC | PRN

## 2020-10-16 MED ORDER — HYDROCODONE-ACETAMINOPHEN 5-325 MG PO TABS
1.0000 | ORAL_TABLET | Freq: Once | ORAL | Status: AC
  Administered 2020-10-16: 1 via ORAL
  Filled 2020-10-16: qty 1

## 2020-10-16 MED ORDER — METHOCARBAMOL 500 MG PO TABS: 500.0000 mg | ORAL_TABLET | Freq: Two times a day (BID) | ORAL | 0 refills | Status: DC

## 2020-10-16 NOTE — Discharge Instructions (Addendum)
Contact a health care provider if: You have new symptoms. Your symptoms get worse or your pain is severe. You have side effects from your medicines. You have trouble sleeping. Your condition is causing depression or anxiety.

## 2020-10-16 NOTE — ED Provider Notes (Signed)
[facility] EMERGENCY DEPARTMENT Provider Note   CSN: [id number] Arrival date & time: 10/16/20  7622     History Chief Complaint  Patient presents with   Back Pain    Jaclyn Butler is a 50 y.o. female who presents with a cc of upper back pain that radiates down her Right side. Worse with deep breathing, change in position, movement of the left arm,  movement of the thorax and movement radiates under the left breast. Pain is stabbing and twisting. She was worked up for the same 12 days ago. She was treated with tramadol which at first was helping but has not been helping over the past several days and was prescribed prednisone but this was not covered by her insurance so she did not take it.  She has been using Biofreeze which helps for about 15 minutes before the pain comes back.  She denies cough, chills, fever, hemoptysis, unilateral leg swelling.  She had a negative D-dimer at her last visit and unremarkable plain films of the chest   Back Pain     Past Medical History:  Diagnosis Date   Bipolar disorder (HCC)    Blood transfusion without reported diagnosis    Diabetes mellitus without complication (HCC)    Hypertension    Miscarriage    Schizophrenia (HCC)     There are no problems to display for this patient.   Past Surgical History:  Procedure Laterality Date   TUBAL LIGATION     TUMOR REMOVAL     from lower bowel when she was 11 years ago     OB History     Gravida  7   Para  6   Term  6   Preterm      AB  1   Living  6      SAB  1   IAB      Ectopic      Multiple      Live Births  6           Family History  Problem Relation Age of Onset   Cirrhosis Father    Diabetes Mother    Diabetes Sister    Blindness Sister    Hypertension Sister    Diabetes Sister    Diabetes Daughter    Hypertension Daughter    Obesity Daughter     Social History   Tobacco Use   Smoking status: Never   Smokeless tobacco: Never  Vaping Use    Vaping Use: Never used  Substance Use Topics   Alcohol use: Not Currently   Drug use: Not Currently    Types: Cocaine    Comment: clean for several years    Home Medications Prior to Admission medications   Medication Sig Start Date End Date Taking? Authorizing Provider  metFORMIN (GLUCOPHAGE) 500 MG tablet Take 500 mg by mouth 2 (two) times daily with a meal.    [provider]  predniSONE (DELTASONE) 10 MG tablet 6, 5, 4, 3, 2 then 1 tablet by mouth daily for 6 days total. 10/04/20   Idol, Raynelle Fanning, PA-C  QUEtiapine (SEROQUEL) 200 MG tablet Take 200 mg by mouth at bedtime.    [provider]  sertraline (ZOLOFT) 50 MG tablet Take 50 mg by mouth daily.    [provider]  traMADol (ULTRAM) 50 MG tablet Take 1 tablet (50 mg total) by mouth every 6 (six) hours as needed. 10/04/20   Burgess Amor, PA-C  Allergies    Naproxen  Review of Systems   Review of Systems  Musculoskeletal:  Positive for back pain.  Ten systems reviewed and are negative for acute change, except as noted in the HPI.   Physical Exam Updated Vital Signs BP (!) 136/102 (BP Location: Left Arm)   Temp 98.3 F (36.8 C) (Oral)   Resp 17   Ht 5\' 2"  (1.575 m)   Wt 104.3 kg   SpO2 98%   BMI 42.07 kg/m   Physical Exam Vitals and nursing note reviewed.  Constitutional:      General: She is not in acute distress.    Appearance: She is well-developed. She is not diaphoretic.  HENT:     Head: Normocephalic and atraumatic.     Right Ear: External ear normal.     Left Ear: External ear normal.     Nose: Nose normal.     Mouth/Throat:     Mouth: Mucous membranes are moist.  Eyes:     General: No scleral icterus.    Conjunctiva/sclera: Conjunctivae normal.  Cardiovascular:     Rate and Rhythm: Normal rate and regular rhythm.     Heart sounds: Normal heart sounds. No murmur heard.   No friction rub. No gallop.  Pulmonary:     Effort: Pulmonary effort is normal. No respiratory distress.      Breath sounds: Normal breath sounds.  Abdominal:     General: Bowel sounds are normal. There is no distension.     Palpations: Abdomen is soft. There is no mass.     Tenderness: There is no abdominal tenderness. There is no guarding.  Musculoskeletal:     Cervical back: Normal range of motion.     Comments: No midline tenderness.  Patient has multiple areas of tenderness along the scapular region, left latissimus, and the lung the left axillary wall.  She has multiple trigger points which are reproducible and sent pain along the left breast.  She has a trigger point in the left pectoralis which also reproduces pain in the left chest.  Skin:    General: Skin is warm and dry.  Neurological:     Mental Status: She is alert and oriented to person, place, and time.  Psychiatric:        Behavior: Behavior normal.    ED Results / Procedures / Treatments   Labs (all labs ordered are listed, but only abnormal results are displayed) Labs Reviewed - No data to display  EKG None  Radiology No results found.  Procedures Procedures   Medications Ordered in ED Medications - No data to display  ED Course  I have reviewed the triage vital signs and the nursing notes.  Pertinent labs & imaging results that were available during my care of the patient were reviewed by me and considered in my medical decision making (see chart for details).    MDM Rules/Calculators/A&P                          Patient here with complaint of left sided pain.  This appears to be myofascial pain syndrome with multiple trigger points.  I feel that the entire left scapular movement complex including agonist and antagonist are involved.  Patient was seen on 10/04/2020 had a 1 view chest x-ray, EKG, urinalysis, CMP, D-dimer, CBC, lipase without any evidence of emergent cause.  She is hemodynamically stable and afebrile.  I doubt any emergent cause of her symptoms  such as pneumothorax, pulmonary embolus, ACS as all  of her symptoms are reproducible with movement and palpation.  The patient is allergic to all anti-inflammatories.  She appears to be in moderate to severe pain with any movement from position.  I reviewed the PDMP and will prescribe Norco, a very short course.  I have advised the patient that she would best be served with physical therapy or massage therapy.  Discussed return precautions. Final Clinical Impression(s) / ED Diagnoses Final diagnoses:  None    Rx / DC Orders ED Discharge Orders     None        Arthor Captain, PA-C 10/16/20 1121    Long, Arlyss Repress, MD 10/19/20 1105

## 2020-10-16 NOTE — ED Triage Notes (Addendum)
Pt to the ED [facility] after she twisted her back while in the shower. Denies falling.  The pt is hurting on the left side.

## 2020-10-16 NOTE — ED Notes (Signed)
IV d/c'd to right hand, catheter intact. Site wnl and dsg applied to site.

## 2022-01-07 ENCOUNTER — Ambulatory Visit: Payer: Medicaid Other | Admitting: Internal Medicine

## 2022-02-04 ENCOUNTER — Ambulatory Visit: Payer: Medicaid Other | Admitting: Internal Medicine

## 2022-02-17 ENCOUNTER — Encounter: Payer: Self-pay | Admitting: Internal Medicine

## 2022-02-17 ENCOUNTER — Other Ambulatory Visit (HOSPITAL_COMMUNITY): Payer: Self-pay | Admitting: Family Medicine

## 2022-02-17 ENCOUNTER — Telehealth: Payer: Self-pay | Admitting: Family Medicine

## 2022-02-17 ENCOUNTER — Ambulatory Visit (INDEPENDENT_AMBULATORY_CARE_PROVIDER_SITE_OTHER): Payer: Medicaid Other | Admitting: Internal Medicine

## 2022-02-17 VITALS — BP 133/89 | HR 86 | Ht 63.0 in | Wt 232.0 lb

## 2022-02-17 DIAGNOSIS — N898 Other specified noninflammatory disorders of vagina: Secondary | ICD-10-CM

## 2022-02-17 DIAGNOSIS — Z0001 Encounter for general adult medical examination with abnormal findings: Secondary | ICD-10-CM

## 2022-02-17 DIAGNOSIS — E1122 Type 2 diabetes mellitus with diabetic chronic kidney disease: Secondary | ICD-10-CM | POA: Diagnosis not present

## 2022-02-17 DIAGNOSIS — Z1231 Encounter for screening mammogram for malignant neoplasm of breast: Secondary | ICD-10-CM

## 2022-02-17 DIAGNOSIS — F315 Bipolar disorder, current episode depressed, severe, with psychotic features: Secondary | ICD-10-CM

## 2022-02-17 DIAGNOSIS — F209 Schizophrenia, unspecified: Secondary | ICD-10-CM

## 2022-02-17 LAB — POCT URINALYSIS DIP (CLINITEK)
Bilirubin, UA: NEGATIVE
Blood, UA: NEGATIVE
Glucose, UA: NEGATIVE mg/dL
Ketones, POC UA: NEGATIVE mg/dL
Leukocytes, UA: NEGATIVE
Nitrite, UA: NEGATIVE
Spec Grav, UA: >=1.03 — AB (ref 1.010–1.025)
Urobilinogen, UA: 0.2 E.U./dL
pH, UA: 5 (ref 5.0–8.0)

## 2022-02-17 MED ORDER — QUETIAPINE FUMARATE 200 MG PO TABS: 200.0000 mg | ORAL_TABLET | Freq: Every day | ORAL | 0 refills | Status: DC

## 2022-02-17 MED ORDER — LANCET DEVICE MISC: 1.0000 | Freq: Three times a day (TID) | 0 refills | Status: DC

## 2022-02-17 MED ORDER — BLOOD GLUCOSE MONITORING SUPPL DEVI: 0 refills | Status: DC

## 2022-02-17 MED ORDER — BLOOD GLUCOSE TEST VI STRP: 1.0000 | ORAL_STRIP | Freq: Three times a day (TID) | 0 refills | Status: DC

## 2022-02-17 MED ORDER — METFORMIN HCL 500 MG PO TABS: 500.0000 mg | ORAL_TABLET | Freq: Two times a day (BID) | ORAL | 0 refills | Status: DC

## 2022-02-17 MED ORDER — LANCETS MISC. MISC: 1.0000 | Freq: Three times a day (TID) | 0 refills | Status: AC

## 2022-02-17 MED ORDER — DICLOFENAC SODIUM 1 % EX GEL: 4.0000 g | Freq: Four times a day (QID) | CUTANEOUS | 0 refills | Status: DC

## 2022-02-17 MED ORDER — SERTRALINE HCL 50 MG PO TABS: 50.0000 mg | ORAL_TABLET | Freq: Every day | ORAL | 0 refills | Status: DC

## 2022-02-17 NOTE — Patient Instructions (Addendum)
Thank you, [name] for allowing [place] to provide your care today.   I have ordered the following labs for you:   Lab Orders         CBC with Differential/Platelet         CMP14+EGFR         Lipid panel         VITAMIN D 25 Hydroxy (Vit-D Deficiency, Fractures)         TSH         Hemoglobin A1c         Microalbumin / creatinine urine ratio         HIV Antibody (routine testing w rflx)         HCV Ab w Reflex to Quant PCR         POCT URINALYSIS DIP (CLINITEK)       Referrals ordered today:    Referral Orders         Ambulatory referral to Gastroenterology        Tamsen Snider, M.D.

## 2022-02-17 NOTE — Progress Notes (Unsigned)
HPI:[name] is a 52 y.o. female who presents to establish care. For the details of today's visit, please refer to the assessment and plan.  Past Medical History:  Diagnosis Date   Bipolar disorder ([place])    Blood transfusion without reported diagnosis    Diabetes mellitus without complication ([place])    Hypertension    Miscarriage    Schizophrenia ([place])     Past Surgical History:  Procedure Laterality Date   TUBAL LIGATION     TUMOR REMOVAL     from lower bowel when she was 60 years ago    Family History  Problem Relation Age of Onset   Cirrhosis Father    Diabetes Mother    Diabetes Sister    Blindness Sister    Hypertension Sister    Diabetes Sister    Diabetes Daughter    Hypertension Daughter    Obesity Daughter     Social History   Tobacco Use   Smoking status: Never   Smokeless tobacco: Never  Vaping Use   Vaping Use: Never used  Substance Use Topics   Alcohol use: Not Currently   Drug use: Not Currently    Types: Cocaine    Comment: clean for several years    Physical Exam: Vitals:   02/17/22 0915  BP: 133/89  Pulse: 86  SpO2: 95%  Weight: 232 lb (105.2 kg)  Height: 5\' 3"  (1.6 m)     Physical Exam Constitutional:      General: She is not in acute distress.    Appearance: She is morbidly obese.  Cardiovascular:     Rate and Rhythm: Normal rate and regular rhythm.  Pulmonary:     Effort: Pulmonary effort is normal.     Breath sounds: Normal breath sounds.  Psychiatric:        Attention and Perception: Attention normal.        Mood and Affect: Mood is depressed.        Speech: Speech normal.        Behavior: Behavior normal. Behavior is cooperative.        Thought Content: Thought content normal. Thought content does not include homicidal or suicidal plan.      Depression, PHQ-9: Based on the patients  Jaclyn Butler Visit from 02/17/2022 in [facility]  PHQ-9 Total Score 16        Assessment & Plan:   Vaginal itching Patient experiencing vaginal itching for 3 days. No change in vaginal discharge. No dysuria or increased frequency of urination.   Plan: - NuSwab Vaginitis Plus (VG+)   Encounter for general adult medical examination with abnormal findings 52 year old female here to establish care. She has never had a screening colonoscopy.  No recent mammogram. No family history of colon cancer or breast cancer. Due for one time HCV and HIV screening.   Plan:  - Screening mammogram  - Ambulatory referral to Gastroenterology - HIV Antibody (routine testing w rflx) - HCV Ab w Reflex to Quant PCR  Morbid obesity ([place]) Patient has BMI 41. Living with T2DM. Reports feeling tired and sluggish at times.   Plan: - CBC with Differential/Platelet - CMP14+EGFR - Lipid panel - VITAMIN D 25 Hydroxy (Vit-D Deficiency, Fractures) - TSH - Amb Referral To Provider Referral Exercise Program (P.R.E.P)   Type 2 diabetes mellitus with chronic kidney disease, without long-term current use of insulin ([place]) Presented to establish care and currently on metformin 500 mg  twice daily.  No hemoglobin A1c's to review.  Patient does not have a glucometer at home.  Plan: - Hemoglobin A1c - Microalbumin / creatinine urine ratio -Continue metformin 500 mg twice daily.  Will make changes to diabetic regimen if needed after hemoglobin A1c results.   Schizophrenia [facility]) Patient here to establish care. Given diagnosis of schizophrenia in previous healthcare system. Requested patient fill out forms at front desk to have records sent to our office. Also has been told she has Bipolar disorder. She has been hearing someone , but reports she knows the person is not real. Reports stable mental health when taking medications. No thoughts of harming herself or others.  Plan:  Restarting medications patient was previously on, referral to Psychiatry    Bipolar I disorder, current or most  recent episode depressed, with psychotic features with melancholic features [facility]) Patient reports history of Bipolar 1 disorder and schizophrenia. She is off of medications and feeling depressed. She has been hearing voices and irritable with family.   Plan: - Obtain records from previous provider - Referral to psychiatry  - Restart Seroquel and sertraline     Jaclyn Dy, MD

## 2022-02-17 NOTE — Telephone Encounter (Signed)
Patient called in needs glucose meter sent in

## 2022-02-17 NOTE — Telephone Encounter (Signed)
Pt came by the office I spoke with pt and sent in glucometer with supplies.

## 2022-02-18 ENCOUNTER — Encounter: Payer: Self-pay | Admitting: *Deleted

## 2022-02-18 DIAGNOSIS — F209 Schizophrenia, unspecified: Secondary | ICD-10-CM | POA: Insufficient documentation

## 2022-02-18 DIAGNOSIS — N898 Other specified noninflammatory disorders of vagina: Secondary | ICD-10-CM | POA: Insufficient documentation

## 2022-02-18 DIAGNOSIS — E119 Type 2 diabetes mellitus without complications: Secondary | ICD-10-CM | POA: Insufficient documentation

## 2022-02-18 DIAGNOSIS — E1122 Type 2 diabetes mellitus with diabetic chronic kidney disease: Secondary | ICD-10-CM | POA: Insufficient documentation

## 2022-02-18 DIAGNOSIS — Z0001 Encounter for general adult medical examination with abnormal findings: Secondary | ICD-10-CM | POA: Insufficient documentation

## 2022-02-18 DIAGNOSIS — F315 Bipolar disorder, current episode depressed, severe, with psychotic features: Secondary | ICD-10-CM | POA: Insufficient documentation

## 2022-02-18 DIAGNOSIS — F319 Bipolar disorder, unspecified: Secondary | ICD-10-CM | POA: Insufficient documentation

## 2022-02-18 LAB — HCV INTERPRETATION

## 2022-02-18 LAB — HCV AB W REFLEX TO QUANT PCR: HCV Ab: NONREACTIVE

## 2022-02-18 LAB — HIV ANTIBODY (ROUTINE TESTING W REFLEX): HIV Screen 4th Generation wRfx: NONREACTIVE

## 2022-02-18 NOTE — Assessment & Plan Note (Signed)
Patient has BMI 41. Living with T2DM. Reports feeling tired and sluggish at times.   Plan: - CBC with Differential/Platelet - CMP14+EGFR - Lipid panel - VITAMIN D 25 Hydroxy (Vit-D Deficiency, Fractures) - TSH - Amb Referral To Provider Referral Exercise Program (P.R.E.P)

## 2022-02-18 NOTE — Assessment & Plan Note (Signed)
Patient reports history of Bipolar 1 disorder and schizophrenia. She is off of medications and feeling depressed. She has been hearing voices and irritable with family.   Plan: - Obtain records from previous provider - Referral to psychiatry  - Restart Seroquel and sertraline

## 2022-02-18 NOTE — Assessment & Plan Note (Signed)
Presented to establish care and currently on metformin 500 mg twice daily.  No hemoglobin A1c's to review.  Patient does not have a glucometer at home.  Plan: - Hemoglobin A1c - Microalbumin / creatinine urine ratio -Continue metformin 500 mg twice daily.  Will make changes to diabetic regimen if needed after hemoglobin A1c results.

## 2022-02-18 NOTE — Assessment & Plan Note (Addendum)
Patient experiencing vaginal itching for 3 days. No change in vaginal discharge. No dysuria or increased frequency of urination.   Plan: - NuSwab Vaginitis Plus (VG+)

## 2022-02-18 NOTE — Assessment & Plan Note (Signed)
52 year old female here to establish care. She has never had a screening colonoscopy.  No recent mammogram. No family history of colon cancer or breast cancer. Due for one time HCV and HIV screening.   Plan:  - Screening mammogram  - Ambulatory referral to Gastroenterology - HIV Antibody (routine testing w rflx) - HCV Ab w Reflex to Quant PCR

## 2022-02-18 NOTE — Assessment & Plan Note (Deleted)
Patient reports history of depression, bipolar disorder, and schizophrenia. She has been off of medication. Reports being very irritable and she is hearing

## 2022-02-18 NOTE — Assessment & Plan Note (Signed)
Patient here to establish care. Given diagnosis of schizophrenia in previous healthcare system. Requested patient fill out forms at front desk to have records sent to our office. Also has been told she has Bipolar disorder. She has been hearing someone , but reports she knows the person is not real. Reports stable mental health when taking medications. No thoughts of harming herself or others.  Plan:  Restarting medications patient was previously on, referral to Psychiatry

## 2022-02-19 ENCOUNTER — Telehealth: Payer: Self-pay | Admitting: Internal Medicine

## 2022-02-19 ENCOUNTER — Telehealth: Payer: Self-pay | Admitting: Family Medicine

## 2022-02-19 NOTE — Telephone Encounter (Signed)
Unable to reach patient by telephone to discuss labs.I will attempt again before sending letter.

## 2022-02-19 NOTE — Telephone Encounter (Signed)
Pt returning call

## 2022-02-19 NOTE — Telephone Encounter (Signed)
Returned call and unable to reach patient. Left voicemail saying I will attempt again tomorrow.

## 2022-02-19 NOTE — Telephone Encounter (Signed)
Returned patient's call. No answer, LMTCB. Dr. Court Joy called her and will try again when he is finished with patients.

## 2022-02-19 NOTE — Telephone Encounter (Signed)
Patient asked for nurse to return call at [phone]

## 2022-02-20 ENCOUNTER — Telehealth: Payer: Self-pay | Admitting: Family Medicine

## 2022-02-20 ENCOUNTER — Telehealth: Payer: Self-pay | Admitting: Internal Medicine

## 2022-02-20 DIAGNOSIS — E1165 Type 2 diabetes mellitus with hyperglycemia: Secondary | ICD-10-CM

## 2022-02-20 DIAGNOSIS — E1169 Type 2 diabetes mellitus with other specified complication: Secondary | ICD-10-CM

## 2022-02-20 DIAGNOSIS — E559 Vitamin D deficiency, unspecified: Secondary | ICD-10-CM

## 2022-02-20 LAB — CMP14+EGFR
ALT: 25 IU/L (ref 0–32)
AST: 24 IU/L (ref 0–40)
Albumin/Globulin Ratio: 1.3 (ref 1.2–2.2)
Albumin: 4.4 g/dL (ref 3.8–4.9)
Alkaline Phosphatase: 106 IU/L (ref 44–121)
BUN/Creatinine Ratio: 12 (ref 9–23)
BUN: 9 mg/dL (ref 6–24)
Bilirubin Total: 0.4 mg/dL (ref 0.0–1.2)
CO2: 24 mmol/L (ref 20–29)
Calcium: 9.5 mg/dL (ref 8.7–10.2)
Chloride: 99 mmol/L (ref 96–106)
Creatinine, Ser: 0.78 mg/dL (ref 0.57–1.00)
Globulin, Total: 3.3 g/dL (ref 1.5–4.5)
Glucose: 218 mg/dL — ABNORMAL HIGH (ref 70–99)
Potassium: 4.3 mmol/L (ref 3.5–5.2)
Sodium: 137 mmol/L (ref 134–144)
Total Protein: 7.7 g/dL (ref 6.0–8.5)
eGFR: 91 mL/min/{1.73_m2} (ref >59)

## 2022-02-20 LAB — CBC WITH DIFFERENTIAL/PLATELET
Basophils Absolute: 0 10*3/uL (ref 0.0–0.2)
Basos: 0 %
EOS (ABSOLUTE): 0.1 10*3/uL (ref 0.0–0.4)
Eos: 1 %
Hematocrit: 42.7 % (ref 34.0–46.6)
Hemoglobin: 13.7 g/dL (ref 11.1–15.9)
Immature Grans (Abs): 0 10*3/uL (ref 0.0–0.1)
Immature Granulocytes: 0 %
Lymphocytes Absolute: 2.1 10*3/uL (ref 0.7–3.1)
Lymphs: 29 %
MCH: 29 pg (ref 26.6–33.0)
MCHC: 32.1 g/dL (ref 31.5–35.7)
MCV: 90 fL (ref 79–97)
Monocytes Absolute: 0.5 10*3/uL (ref 0.1–0.9)
Monocytes: 6 %
Neutrophils Absolute: 4.6 10*3/uL (ref 1.4–7.0)
Neutrophils: 64 %
Platelets: 305 10*3/uL (ref 150–450)
RBC: 4.73 x10E6/uL (ref 3.77–5.28)
RDW: 13.1 % (ref 11.7–15.4)
WBC: 7.3 10*3/uL (ref 3.4–10.8)

## 2022-02-20 LAB — MICROALBUMIN / CREATININE URINE RATIO
Creatinine, Urine: 201.4 mg/dL
Microalb/Creat Ratio: 21 mg/g creat (ref 0–29)
Microalbumin, Urine: 43.2 ug/mL

## 2022-02-20 LAB — TSH: TSH: 1.76 u[IU]/mL (ref 0.450–4.500)

## 2022-02-20 LAB — LIPID PANEL
Chol/HDL Ratio: 5.6 ratio — ABNORMAL HIGH (ref 0.0–4.4)
Cholesterol, Total: 253 mg/dL — ABNORMAL HIGH (ref 100–199)
HDL: 45 mg/dL (ref >39)
LDL Chol Calc (NIH): 183 mg/dL — ABNORMAL HIGH (ref 0–99)
Triglycerides: 138 mg/dL (ref 0–149)
VLDL Cholesterol Cal: 25 mg/dL (ref 5–40)

## 2022-02-20 LAB — VITAMIN D 25 HYDROXY (VIT D DEFICIENCY, FRACTURES): Vit D, 25-Hydroxy: 20.2 ng/mL — ABNORMAL LOW (ref 30.0–100.0)

## 2022-02-20 LAB — HEMOGLOBIN A1C
Est. average glucose Bld gHb Est-mCnc: 275 mg/dL
Hgb A1c MFr Bld: 11.2 % — ABNORMAL HIGH (ref 4.8–5.6)

## 2022-02-20 MED ORDER — VITAMIN D3 20 MCG (800 UNIT) PO TABS: 1.0000 | ORAL_TABLET | Freq: Every day | ORAL | 0 refills | Status: DC

## 2022-02-20 MED ORDER — METFORMIN HCL ER 500 MG PO TB24: ORAL_TABLET | ORAL | 1 refills | Status: DC

## 2022-02-20 MED ORDER — ATORVASTATIN CALCIUM 40 MG PO TABS: 40.0000 mg | ORAL_TABLET | Freq: Every day | ORAL | 2 refills | Status: DC

## 2022-02-20 NOTE — Telephone Encounter (Signed)
Reviewed labs with patient. She does not like injections. She actually is out of Metformin and HgbA1c is reflective of this. She will stop drinking soft drinks. Will start Metformin with ramp up and have patient follow up in 1 month. Plan to start GLP-1 and see if patient is tolerating Metformin. She will call and make appointment.   Type 2 diabetes with HLD and obesity, will target LDL goal of <70. Start Atorvastatin 40 mg  Vitamin D is low, start supplementation as below.   - metFORMIN (GLUCOPHAGE-XR) 500 MG 24 hr tablet; Take 500 mg by mouth once daily x1 week, then increase to 500 mg twice daily x1 week, then 1,000 mg in the AM and 500 mg in the PM x1 week, and finally 1,000 mg twice daily. Take with food.  Dispense: 180 tablet; Refill: 1 - Cholecalciferol (VITAMIN D3) 20 MCG (800 UNIT) TABS; Take 1 tablet by mouth daily at 6 (six) AM.  Dispense: 75 tablet; Refill: 0 - atorvastatin (LIPITOR) 40 MG tablet; Take 1 tablet (40 mg total) by mouth daily.  Dispense: 90 tablet; Refill: 2

## 2022-02-20 NOTE — Telephone Encounter (Signed)
Pt returning call   Please call pt at [phone]

## 2022-02-20 NOTE — Telephone Encounter (Signed)
Dr Court Joy spoke with her and reviewed labs

## 2022-02-20 NOTE — Telephone Encounter (Signed)
Dr Court Joy spoke with pt and reviewed labs

## 2022-02-20 NOTE — Telephone Encounter (Signed)
Patient called asking for lab results, to please return her call

## 2022-02-20 NOTE — Telephone Encounter (Signed)
Patient called, stated that she missed a call regarding her results.  She stated she is sitting right by her phone.  Pt's # [phone]

## 2022-02-21 LAB — NUSWAB VAGINITIS PLUS (VG+)
Candida albicans, NAA: NEGATIVE
Candida glabrata, NAA: NEGATIVE
Chlamydia trachomatis, NAA: NEGATIVE
Neisseria gonorrhoeae, NAA: NEGATIVE
Trich vag by NAA: NEGATIVE

## 2022-02-24 ENCOUNTER — Ambulatory Visit: Payer: Medicaid Other | Admitting: Internal Medicine

## 2022-03-10 ENCOUNTER — Other Ambulatory Visit: Payer: Self-pay

## 2022-03-10 ENCOUNTER — Telehealth: Payer: Self-pay | Admitting: Family Medicine

## 2022-03-10 DIAGNOSIS — E1122 Type 2 diabetes mellitus with diabetic chronic kidney disease: Secondary | ICD-10-CM

## 2022-03-10 DIAGNOSIS — E559 Vitamin D deficiency, unspecified: Secondary | ICD-10-CM

## 2022-03-10 DIAGNOSIS — E1169 Type 2 diabetes mellitus with other specified complication: Secondary | ICD-10-CM

## 2022-03-10 DIAGNOSIS — F315 Bipolar disorder, current episode depressed, severe, with psychotic features: Secondary | ICD-10-CM

## 2022-03-10 DIAGNOSIS — Z0001 Encounter for general adult medical examination with abnormal findings: Secondary | ICD-10-CM

## 2022-03-10 DIAGNOSIS — E1165 Type 2 diabetes mellitus with hyperglycemia: Secondary | ICD-10-CM

## 2022-03-10 MED ORDER — LANCET DEVICE MISC: 1.0000 | Freq: Three times a day (TID) | 0 refills | Status: AC

## 2022-03-10 MED ORDER — BLOOD GLUCOSE TEST VI STRP: 1.0000 | ORAL_STRIP | Freq: Three times a day (TID) | 0 refills | Status: AC

## 2022-03-10 MED ORDER — BLOOD GLUCOSE MONITORING SUPPL DEVI: 0 refills | Status: DC

## 2022-03-10 MED ORDER — DICLOFENAC SODIUM 1 % EX GEL: 4.0000 g | Freq: Four times a day (QID) | CUTANEOUS | 0 refills | Status: AC

## 2022-03-10 MED ORDER — VITAMIN D3 20 MCG (800 UNIT) PO TABS: 1.0000 | ORAL_TABLET | Freq: Every day | ORAL | 0 refills | Status: DC

## 2022-03-10 MED ORDER — QUETIAPINE FUMARATE 200 MG PO TABS: 200.0000 mg | ORAL_TABLET | Freq: Every day | ORAL | 0 refills | Status: DC

## 2022-03-10 MED ORDER — METFORMIN HCL ER 500 MG PO TB24: ORAL_TABLET | ORAL | 1 refills | Status: DC

## 2022-03-10 MED ORDER — SERTRALINE HCL 50 MG PO TABS: 50.0000 mg | ORAL_TABLET | Freq: Every day | ORAL | 0 refills | Status: AC

## 2022-03-10 MED ORDER — ATORVASTATIN CALCIUM 40 MG PO TABS: 40.0000 mg | ORAL_TABLET | Freq: Every day | ORAL | 2 refills | Status: DC

## 2022-03-10 NOTE — Telephone Encounter (Signed)
Refills sent
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
## 2022-03-10 NOTE — Telephone Encounter (Signed)
Yes

## 2022-03-16 ENCOUNTER — Emergency Department (HOSPITAL_COMMUNITY)
Admission: EM | Admit: 2022-03-16 | Discharge: 2022-03-16 | Disposition: A | Discharge status: Home / Self Care | Payer: Medicaid Other | Attending: Emergency Medicine | Admitting: Emergency Medicine

## 2022-03-16 ENCOUNTER — Other Ambulatory Visit: Payer: Self-pay

## 2022-03-16 ENCOUNTER — Emergency Department (HOSPITAL_COMMUNITY): Payer: Medicaid Other

## 2022-03-16 ENCOUNTER — Encounter (HOSPITAL_COMMUNITY): Payer: Self-pay | Admitting: Emergency Medicine

## 2022-03-16 DIAGNOSIS — K5792 Diverticulitis of intestine, part unspecified, without perforation or abscess without bleeding: Secondary | ICD-10-CM | POA: Insufficient documentation

## 2022-03-16 DIAGNOSIS — E1165 Type 2 diabetes mellitus with hyperglycemia: Secondary | ICD-10-CM | POA: Diagnosis not present

## 2022-03-16 DIAGNOSIS — Z7984 Long term (current) use of oral hypoglycemic drugs: Secondary | ICD-10-CM | POA: Insufficient documentation

## 2022-03-16 DIAGNOSIS — R000 Tachycardia, unspecified: Secondary | ICD-10-CM | POA: Insufficient documentation

## 2022-03-16 DIAGNOSIS — R350 Frequency of micturition: Secondary | ICD-10-CM | POA: Diagnosis not present

## 2022-03-16 DIAGNOSIS — D72829 Elevated white blood cell count, unspecified: Secondary | ICD-10-CM | POA: Diagnosis not present

## 2022-03-16 DIAGNOSIS — R1032 Left lower quadrant pain: Secondary | ICD-10-CM | POA: Diagnosis present

## 2022-03-16 LAB — URINALYSIS, ROUTINE W REFLEX MICROSCOPIC
Bacteria, UA: NONE SEEN
Bilirubin Urine: NEGATIVE
Glucose, UA: >=500 mg/dL — AB
Hgb urine dipstick: NEGATIVE
Ketones, ur: NEGATIVE mg/dL
Leukocytes,Ua: NEGATIVE
Nitrite: NEGATIVE
Protein, ur: NEGATIVE mg/dL
Specific Gravity, Urine: 1.029 (ref 1.005–1.030)
pH: 5 (ref 5.0–8.0)

## 2022-03-16 LAB — CBC WITH DIFFERENTIAL/PLATELET
Abs Immature Granulocytes: 0.04 10*3/uL (ref 0.00–0.07)
Basophils Absolute: 0 10*3/uL (ref 0.0–0.1)
Basophils Relative: 0 %
Eosinophils Absolute: 0.1 10*3/uL (ref 0.0–0.5)
Eosinophils Relative: 0 %
HCT: 39.2 % (ref 36.0–46.0)
Hemoglobin: 12.7 g/dL (ref 12.0–15.0)
Immature Granulocytes: 0 %
Lymphocytes Relative: 18 %
Lymphs Abs: 2.3 10*3/uL (ref 0.7–4.0)
MCH: 29.3 pg (ref 26.0–34.0)
MCHC: 32.4 g/dL (ref 30.0–36.0)
MCV: 90.5 fL (ref 80.0–100.0)
Monocytes Absolute: 0.7 10*3/uL (ref 0.1–1.0)
Monocytes Relative: 6 %
Neutro Abs: 9.5 10*3/uL — ABNORMAL HIGH (ref 1.7–7.7)
Neutrophils Relative %: 76 %
Platelets: 276 10*3/uL (ref 150–400)
RBC: 4.33 MIL/uL (ref 3.87–5.11)
RDW: 14.3 % (ref 11.5–15.5)
WBC: 12.6 10*3/uL — ABNORMAL HIGH (ref 4.0–10.5)
nRBC: 0 % (ref 0.0–0.2)

## 2022-03-16 LAB — BASIC METABOLIC PANEL
Anion gap: 10 (ref 5–15)
BUN: 11 mg/dL (ref 6–20)
CO2: 21 mmol/L — ABNORMAL LOW (ref 22–32)
Calcium: 8.5 mg/dL — ABNORMAL LOW (ref 8.9–10.3)
Chloride: 98 mmol/L (ref 98–111)
Creatinine, Ser: 0.84 mg/dL (ref 0.44–1.00)
GFR, Estimated: >60 mL/min (ref >60)
Glucose, Bld: 440 mg/dL — ABNORMAL HIGH (ref 70–99)
Potassium: 3.8 mmol/L (ref 3.5–5.1)
Sodium: 129 mmol/L — ABNORMAL LOW (ref 135–145)

## 2022-03-16 LAB — CBG MONITORING, ED: Glucose-Capillary: 406 mg/dL — ABNORMAL HIGH (ref 70–99)

## 2022-03-16 MED ORDER — LACTATED RINGERS IV BOLUS
1000.0000 mL | Freq: Once | INTRAVENOUS | Status: AC
  Administered 2022-03-16: 1000 mL via INTRAVENOUS

## 2022-03-16 MED ORDER — AMOXICILLIN-POT CLAVULANATE 875-125 MG PO TABS: 1.0000 | ORAL_TABLET | Freq: Two times a day (BID) | ORAL | 0 refills | Status: DC

## 2022-03-16 MED ORDER — OXYCODONE-ACETAMINOPHEN 5-325 MG PO TABS: 1.0000 | ORAL_TABLET | Freq: Four times a day (QID) | ORAL | 0 refills | Status: DC | PRN

## 2022-03-16 MED ORDER — METFORMIN HCL 500 MG PO TABS
500.0000 mg | ORAL_TABLET | Freq: Once | ORAL | Status: AC
  Administered 2022-03-16: 500 mg via ORAL
  Filled 2022-03-16: qty 1

## 2022-03-16 MED ORDER — HYDROMORPHONE HCL 1 MG/ML IJ SOLN
1.0000 mg | Freq: Once | INTRAMUSCULAR | Status: AC
  Administered 2022-03-16: 1 mg via INTRAVENOUS
  Filled 2022-03-16: qty 1

## 2022-03-16 MED ORDER — AMOXICILLIN-POT CLAVULANATE 875-125 MG PO TABS
1.0000 | ORAL_TABLET | Freq: Once | ORAL | Status: AC
  Administered 2022-03-16: 1 via ORAL
  Filled 2022-03-16: qty 1

## 2022-03-16 MED ORDER — ONDANSETRON HCL 4 MG/2ML IJ SOLN
4.0000 mg | Freq: Once | INTRAMUSCULAR | Status: AC
  Administered 2022-03-16: 4 mg via INTRAVENOUS
  Filled 2022-03-16: qty 2
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
## 2022-03-16 NOTE — ED Triage Notes (Signed)
Patient brought in via EMS from home. Alert and oriented. Airway patent. Patient c/o left flank pain that started yesterday and has progressively gotten worse. Per patient nausea without vomiting. Denies any radiation of pain or fevers. Per patient polyuria. Patient is diabetic. Blood sugar per paramedic 360 which patient states is elevated for her, which is usually 160s.

## 2022-03-16 NOTE — Discharge Instructions (Addendum)
You are seen today in the emergency department for left sided abdominal pain.  You have a diverticulitis, take the antibiotics twice daily for the next week.  Keep a clear liquid diet for the next 24 hours, after that you can eat solid foods slowly is able to be tolerated.  Return to the ED for intractable pain, nausea or vomiting.  Follow-up with your GI doctor for reevaluation in the next few months.

## 2022-03-17 ENCOUNTER — Telehealth: Payer: Self-pay | Admitting: Family Medicine

## 2022-03-17 ENCOUNTER — Other Ambulatory Visit: Payer: Self-pay | Admitting: Family Medicine

## 2022-03-17 MED ORDER — LIDOCAINE 5 % EX OINT: 1.0000 | TOPICAL_OINTMENT | CUTANEOUS | 0 refills | Status: AC | PRN

## 2022-03-17 NOTE — Telephone Encounter (Signed)
Send Lidocaine gel to pharmacy , advise patient to make an appointment with me for further evaluation.

## 2022-03-17 NOTE — Telephone Encounter (Signed)
Patient called said this does not help pain in her legs diclofenac Sodium (VOLTAREN) 1 % GEL [id number]  Call patient if something else can be called in. Pharmacy: [facility]

## 2022-03-18 ENCOUNTER — Telehealth: Payer: Self-pay

## 2022-03-18 NOTE — Transitions of Care (Post Inpatient/ED Visit) (Unsigned)
   03/18/2022  Name: Jaclyn Butler MRN: [id number] DOB: March 07, 1970  Today's TOC FU Call Status: Today's TOC FU Call Status:: Unsuccessul Call (1st Attempt) Unsuccessful Call (1st Attempt) Date: 03/18/22  Attempted to reach the patient regarding the most recent Inpatient/ED visit.  Follow Up Plan: Additional outreach attempts will be made to reach the patient to complete the Transitions of Care (Post Inpatient/ED visit) call.   [place] LPN [place] Advisor Direct Dial [phone]

## 2022-03-19 NOTE — Transitions of Care (Post Inpatient/ED Visit) (Unsigned)
   03/19/2022  Name: Jaclyn Butler MRN: [id number] DOB: 1970/11/21  Today's TOC FU Call Status: Today's TOC FU Call Status:: Unsuccessful Call (2nd Attempt) Unsuccessful Call (1st Attempt) Date: 03/18/22 Unsuccessful Call (2nd Attempt) Date: 03/19/22  Attempted to reach the patient regarding the most recent Inpatient/ED visit.  Follow Up Plan: Additional outreach attempts will be made to reach the patient to complete the Transitions of Care (Post Inpatient/ED visit) call.   [place] LPN [place] Advisor Direct Dial [phone]

## 2022-03-19 NOTE — Telephone Encounter (Signed)
LMTCB

## 2022-03-20 NOTE — Transitions of Care (Post Inpatient/ED Visit) (Signed)
   03/20/2022  Name: Jaclyn Butler MRN: [id number] DOB: April 18, 1970  Today's TOC FU Call Status: Today's TOC FU Call Status:: Unsuccessful Call (3rd Attempt) Unsuccessful Call (1st Attempt) Date: 03/18/22 Unsuccessful Call (2nd Attempt) Date: 03/19/22 Unsuccessful Call (3rd Attempt) Date: 03/20/22  Attempted to reach the patient regarding the most recent Inpatient/ED visit.  Follow Up Plan: No further outreach attempts will be made at this time. We have been unable to contact the patient.  [place] LPN [place] Advisor Direct Dial [phone]

## 2022-03-24 ENCOUNTER — Ambulatory Visit (HOSPITAL_COMMUNITY): Payer: Medicaid Other

## 2022-03-28 ENCOUNTER — Telehealth: Payer: Self-pay | Admitting: Family Medicine

## 2022-03-28 NOTE — Telephone Encounter (Signed)
Pt returning call   Call back # [phone]

## 2022-03-31 ENCOUNTER — Telehealth: Payer: Self-pay | Admitting: Family Medicine
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
## 2022-03-31 NOTE — Telephone Encounter (Signed)
Called patient, LMTCB. Advised on VM that we can not refer out of state and she might have to go to the ED.

## 2022-03-31 NOTE — Telephone Encounter (Signed)
Returned patients call. Left VM

## 2022-03-31 NOTE — Telephone Encounter (Signed)
Yes we can not referral out of state

## 2022-04-03 ENCOUNTER — Telehealth (INDEPENDENT_AMBULATORY_CARE_PROVIDER_SITE_OTHER): Payer: Self-pay | Admitting: Gastroenterology
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
## 2022-04-08 NOTE — Telephone Encounter (Signed)
Patient is confused on what to do, please return patient call at [phone]

## 2022-04-09 ENCOUNTER — Ambulatory Visit (HOSPITAL_COMMUNITY): Payer: Medicaid Other

## 2022-04-10 ENCOUNTER — Ambulatory Visit (INDEPENDENT_AMBULATORY_CARE_PROVIDER_SITE_OTHER): Payer: Medicaid Other | Admitting: Gastroenterology

## 2022-04-10 ENCOUNTER — Encounter (INDEPENDENT_AMBULATORY_CARE_PROVIDER_SITE_OTHER): Payer: Self-pay | Admitting: Gastroenterology

## 2022-04-10 VITALS — BP 141/93 | HR 92 | Temp 98.7°F | Ht 63.0 in | Wt 233.1 lb

## 2022-04-10 DIAGNOSIS — K5900 Constipation, unspecified: Secondary | ICD-10-CM | POA: Diagnosis not present

## 2022-04-10 DIAGNOSIS — R194 Change in bowel habit: Secondary | ICD-10-CM

## 2022-04-10 DIAGNOSIS — Z8719 Personal history of other diseases of the digestive system: Secondary | ICD-10-CM

## 2022-04-10 MED ORDER — PEG 3350-KCL-NA BICARB-NACL 420 G PO SOLR: 4000.0000 mL | Freq: Once | ORAL | 0 refills | Status: AC

## 2022-04-10 NOTE — Progress Notes (Signed)
Referring Provider: Damaris Hippo* Primary Care Physician:  Juanda Chance, FNP Primary GI Physician: New   Chief Complaint  Patient presents with   Constipation    Constipation. Takes laxatives. Has taken her mother's linzess ( unsure of dose) and it did help. Would like to schedule before April 3rd.    HPI:   Jaclyn Butler is a 52 y.o. female with past medical history of bipolar disorder, DM, HTN, schizophrenia   Patient presenting today for constipation and to schedule colonoscopy.  Recent labs with TSh 1.76, calcium 8.5, sodium 129, glucose 440, WBC 12.6, hgb 12.7   Recent CT renal stone study 03/16/22 with acute uncomplicated diverticulitis, fatty infiltration of the liver   She notes that she has always had issue with constipation, though worse recently. She is having to take linzess about 3 days out of the week, she did get the linzess from her mother, unsure of dosage. Only has a Bm on days she takes linzess. She can go up to 3 days without a BM. She has harder, stool balls most of the time. She drinks a lot of water, does do some veggies but due to not having teeth, she has to eat soft veggies. She is avoiding fried foods, doing baked and broiled food. No changes in appetite or unintentional weight loss. Blood sugars have been in the 200s the past few days.   She notes that she has had diverticulitis in the past, never had a colonoscopy. She had recent diagnosis of diverticulitis on feb 25, she was given augmentin course which she completed. She denies any diarrhea, rectal bleeding, fevers or chills. No nausea or vomiting. Having LLQ pain at time of diagnosis which has improved.   NSAID use: just tylenol  Social hx: no etoh or tobacco  Fam hx: no CRC, father had cirrhosis due to ETOH   Last Colonoscopy: never  Last Endoscopy: never  Recommendations:    Past Medical History:  Diagnosis Date   Bipolar disorder ([place])    Blood transfusion without reported  diagnosis    Diabetes mellitus without complication ([place])    Hypertension    Miscarriage    Schizophrenia ([place])     Past Surgical History:  Procedure Laterality Date   TUBAL LIGATION     TUMOR REMOVAL     from lower bowel when she was 11 years ago    Current Outpatient Medications  Medication Sig Dispense Refill   atorvastatin (LIPITOR) 40 MG tablet Take 1 tablet (40 mg total) by mouth daily. 90 tablet 2   Blood Glucose Monitoring Suppl DEVI May substitute to any manufacturer covered by patient's insurance. 1 each 0   diclofenac Sodium (VOLTAREN) 1 % GEL Apply 4 g topically 4 (four) times daily. 150 g 0   metFORMIN (GLUCOPHAGE-XR) 500 MG 24 hr tablet Take 500 mg by mouth once daily x1 week, then increase to 500 mg twice daily x1 week, then 1,000 mg in the AM and 500 mg in the PM x1 week, and finally 1,000 mg twice daily. Take with food. 180 tablet 1   oxyCODONE-acetaminophen (PERCOCET/ROXICET) 5-325 MG tablet Take 1 tablet by mouth every 6 (six) hours as needed for severe pain. 6 tablet 0   QUEtiapine (SEROQUEL) 200 MG tablet Take 1 tablet (200 mg total) by mouth at bedtime. 90 tablet 0   Cholecalciferol (VITAMIN D3) 20 MCG (800 UNIT) TABS Take 1 tablet by mouth daily at 6 (six) AM. (Patient not taking: Reported  on 04/10/2022) 75 tablet 0   lidocaine (XYLOCAINE) 5 % ointment Apply 1 Application topically as needed for moderate pain. (Patient not taking: Reported on 04/10/2022) 35.44 g 0   sertraline (ZOLOFT) 50 MG tablet Take 1 tablet (50 mg total) by mouth daily. (Patient not taking: Reported on 04/10/2022) 90 tablet 0   No current facility-administered medications for this visit.    Allergies as of 04/10/2022 - Review Complete 04/10/2022  Allergen Reaction Noted   Naproxen Hives 12/29/2018    Family History  Problem Relation Age of Onset   Cirrhosis Father    Diabetes Mother    Diabetes Sister    Blindness Sister    Hypertension Sister    Diabetes Sister    Diabetes Daughter     Hypertension Daughter    Obesity Daughter     Social History   Socioeconomic History   Marital status: Married    Spouse name: Not on file   Number of children: Not on file   Years of education: Not on file   Highest education level: Not on file  Occupational History   Not on file  Tobacco Use   Smoking status: Never    Passive exposure: Never   Smokeless tobacco: Never  Vaping Use   Vaping Use: Never used  Substance and Sexual Activity   Alcohol use: Not Currently   Drug use: Not Currently    Types: Cocaine    Comment: clean for several years   Sexual activity: Yes    Birth control/protection: Post-menopausal  Other Topics Concern   Not on file  Social History Narrative   Not on file   Social Determinants of Health   Financial Resource Strain: Not on file  Food Insecurity: Not on file  Transportation Needs: Not on file  Physical Activity: Not on file  Stress: Not on file  Social Connections: Not on file    Review of systems General: negative for malaise, night sweats, fever, chills, weight loss Neck: Negative for lumps, goiter, pain and significant neck swelling Resp: Negative for cough, wheezing, dyspnea at rest CV: Negative for chest pain, leg swelling, palpitations, orthopnea GI: denies melena, hematochezia, nausea, vomiting, diarrhea, dysphagia, odyonophagia, early satiety or unintentional weight loss+constipation   MSK: Negative for joint pain or swelling, back pain, and muscle pain. Derm: Negative for itching or rash Psych: Denies depression, anxiety, memory loss, confusion. No homicidal or suicidal ideation.  Heme: Negative for prolonged bleeding, bruising easily, and swollen nodes. Endocrine: Negative for cold or heat intolerance, polyuria, polydipsia and goiter. Neuro: negative for tremor, gait imbalance, syncope and seizures. The remainder of the review of systems is noncontributory.  Physical Exam: BP (!) 141/93   Pulse 92   Temp 98.7 F (37.1  C) (Oral)   Ht 5\' 3"  (1.6 m)   Wt 233 lb 1.6 oz (105.7 kg)   BMI 41.29 kg/m  General:   Alert and oriented. No distress noted. Pleasant and cooperative.  Head:  Normocephalic and atraumatic. Eyes:  Conjuctiva clear without scleral icterus. Mouth:  Oral mucosa pink and moist. Good dentition. No lesions. Heart: Normal rate and rhythm, s1 and s2 heart sounds present.  Lungs: Clear lung sounds in all lobes. Respirations equal and unlabored. Abdomen:  +BS, soft, non-tender and non-distended. No rebound or guarding. No HSM or masses noted. Derm: No palmar erythema or jaundice Msk:  Symmetrical without gross deformities. Normal posture. Extremities:  Without edema. Neurologic:  Alert and  oriented x4 Psych:  Alert and  cooperative. Normal mood and affect.  Invalid input(s): "6 MONTHS"   ASSESSMENT: Jaclyn Butler is a 52 y.o. female presenting today as a new patient for constipation and to schedule colonoscopy.  Constipation has been chronic though worse recently, TSH normal, sodium low at 129. Etiology of worsening constipation is not clear. She also has history of diverticulitis, with  most recent episode in February. She has never had a colonoscopy. Given her age, incidence of diverticulitis and change in bowel habits would recommend proceeding with colonoscopy for further evaluation. Indications, risks and benefits of procedure discussed in detail with patient. Patient verbalized understanding and is in agreement to proceed with Colonoscopy. Linzess is not covered by her insurance so will provided trulance samples for her to try, she will let me know how this works and can send prescription for her at that time.    PLAN:  Colonoscopy after April 7th-ASA III  2. Increase water intake, aim for atleast 64 oz per day Increase fruits, veggies and whole grains, kiwi and prunes are especially good for constipation 3. Trulance 3mg  daily-samples given, Rx if this works well  All questions were  answered, patient verbalized understanding and is in agreement with plan as outlined above.    Follow Up: 3 months   Seri Kimmer L. Alver Sorrow, MSN, APRN, AGNP-C Adult-Gerontology Nurse Practitioner [facility]  I have reviewed the note and agree with the APP's assessment as described in this progress note  Maylon Peppers, MD Gastroenterology and Hepatology [facility]

## 2022-04-10 NOTE — Patient Instructions (Addendum)
We will get you scheduled for colonoscopy, this will need to be after April 7th Increase water intake, aim for atleast 64 oz per day Increase fruits, veggies and whole grains, kiwi and prunes are especially good for constipation I have provided trulance samples, please let me know if this works well for you  Follow up 3 months  It was a pleasure to see you today. I want to create trusting relationships with patients and provide genuine, compassionate, and quality care. I truly value your feedback! please be on the lookout for a survey regarding your visit with me today. I appreciate your input about our visit and your time in completing this!    Khyran Riera L. Alver Sorrow, MSN, APRN, AGNP-C Adult-Gerontology Nurse Practitioner [facility] Gastroenterology at [facility]

## 2022-04-11 ENCOUNTER — Telehealth (INDEPENDENT_AMBULATORY_CARE_PROVIDER_SITE_OTHER): Payer: Self-pay | Admitting: Gastroenterology

## 2022-04-11 NOTE — Telephone Encounter (Signed)
Contacted pt. Pt states she contacted her insurance and they are changing her over to [facility] on 04/21/22. Advised pt that I would have to wait until 04/21/22 to complete PA.Marland Kitchen   Per [facility] Patient name Jaclyn Butler Member number [id number] O Group number [facility] Product - Relationship Employee Effective date 11/20/2021 Termination date 04/20/2022 Insurance type Medicaid Verbal language preference English Written language preference -  End date has been adjusted to match the member's eligibility end date. Start date 04/11/2022  MM/DD/YYYY End date

## 2022-04-11 NOTE — Telephone Encounter (Signed)
Pt scheduled for colonoscopy on 05/13/22. Pt was set up during office visit. Currently pt has [employer]  Attempted PA through [facility] but it states termination date for member is 04/20/22. [employer] and was told that pt would need to call Member Services.   Contacted pt and gave number to member services.

## 2022-04-11 NOTE — Telephone Encounter (Signed)
Pt left voicemail asking for call back regarding insurance.  Returned call to patient but line was busy.

## 2022-04-12 DIAGNOSIS — R194 Change in bowel habit: Secondary | ICD-10-CM | POA: Insufficient documentation

## 2022-04-12 DIAGNOSIS — Z8719 Personal history of other diseases of the digestive system: Secondary | ICD-10-CM | POA: Insufficient documentation

## 2022-04-14 ENCOUNTER — Ambulatory Visit (INDEPENDENT_AMBULATORY_CARE_PROVIDER_SITE_OTHER): Payer: Medicaid Other | Admitting: Family Medicine

## 2022-04-14 ENCOUNTER — Other Ambulatory Visit (HOSPITAL_COMMUNITY)
Admission: RE | Admit: 2022-04-14 | Discharge: 2022-04-14 | Disposition: A | Discharge status: Home / Self Care | Payer: Medicaid Other | Source: Ambulatory Visit | Attending: Family Medicine | Admitting: Family Medicine

## 2022-04-14 ENCOUNTER — Encounter: Payer: Self-pay | Admitting: Family Medicine

## 2022-04-14 ENCOUNTER — Other Ambulatory Visit: Payer: Self-pay | Admitting: Family Medicine

## 2022-04-14 VITALS — BP 136/88 | HR 80 | Ht 63.0 in | Wt 230.0 lb

## 2022-04-14 DIAGNOSIS — E1169 Type 2 diabetes mellitus with other specified complication: Secondary | ICD-10-CM | POA: Diagnosis not present

## 2022-04-14 DIAGNOSIS — Z124 Encounter for screening for malignant neoplasm of cervix: Secondary | ICD-10-CM

## 2022-04-14 DIAGNOSIS — E119 Type 2 diabetes mellitus without complications: Secondary | ICD-10-CM

## 2022-04-14 DIAGNOSIS — F339 Major depressive disorder, recurrent, unspecified: Secondary | ICD-10-CM | POA: Insufficient documentation

## 2022-04-14 DIAGNOSIS — Z8659 Personal history of other mental and behavioral disorders: Secondary | ICD-10-CM | POA: Diagnosis not present

## 2022-04-14 DIAGNOSIS — E785 Hyperlipidemia, unspecified: Secondary | ICD-10-CM

## 2022-04-14 MED ORDER — TRULICITY 0.75 MG/0.5ML ~~LOC~~ SOAJ: 0.7500 mg | SUBCUTANEOUS | 2 refills | Status: DC

## 2022-04-14 MED ORDER — BLOOD GLUCOSE MONITORING SUPPL DEVI: 0 refills | Status: AC

## 2022-04-14 MED ORDER — GLIMEPIRIDE 2 MG PO TABS: 2.0000 mg | ORAL_TABLET | Freq: Every day | ORAL | 3 refills | Status: AC

## 2022-04-14 MED ORDER — QUETIAPINE FUMARATE 200 MG PO TABS: 200.0000 mg | ORAL_TABLET | Freq: Every day | ORAL | 0 refills | Status: DC

## 2022-04-14 MED ORDER — ATORVASTATIN CALCIUM 40 MG PO TABS: 40.0000 mg | ORAL_TABLET | Freq: Every day | ORAL | 2 refills | Status: DC

## 2022-04-14 MED ORDER — FREESTYLE LANCETS MISC: 12 refills | Status: AC

## 2022-04-14 MED ORDER — METFORMIN HCL 1000 MG PO TABS: 1000.0000 mg | ORAL_TABLET | Freq: Two times a day (BID) | ORAL | 3 refills | Status: DC

## 2022-04-14 NOTE — Assessment & Plan Note (Signed)
Pap smear done, Patient tolerated procedure well. Informed patient I will keep them updated on results. Left and right breasts appear normal, no suspicious masses, no skin or nipple changes or axillary nodes.

## 2022-04-14 NOTE — Assessment & Plan Note (Addendum)
Hemoglobin A1c 11.2 on 02/17/22 Plan of care will included increasing metformin dosage 1000 mg twice daily, Glimepiride 2.0 mg daily and Trulicity injection A999333 mg once weekly for initial therapy Follow up in 3 months to recheck labs  Discussed start lifestyle modifications follow diet low in saturated fat, reduce dietary salt intake, avoid fatty foods, maintain an exercise routine 3 to 5 days a week for a minimum total of 150 minutes.

## 2022-04-14 NOTE — Patient Instructions (Signed)
It was pleasure meeting with you today. Please take medications as prescribed. Follow up with your primary health provider if any health concerns arises.  

## 2022-04-14 NOTE — Progress Notes (Signed)
Patient Office Visit   Subjective   Patient ID: Jaclyn Butler, female    DOB: 1970/02/23  Age: 52 y.o. MRN: [id number]  CC:  Chief Complaint  Patient presents with   Hand Pain    Patient states she sees things coming out of her fingers. She knows there is not, but still sees them. Wants to see behavioral health but not virtually.    Headache    Patient complains of headaches and leg pain.     HPI Jaclyn Butler 52 year old female presents to the clinic for pap smear and follow up labs. She  has a past medical history of Bipolar disorder ([place]), Blood transfusion without reported diagnosis, Diabetes mellitus without complication ([place]), Hypertension, Miscarriage, and Schizophrenia ([place]). Refer to plan and assessment for details of today's visit.  HPI    Outpatient Encounter Medications as of 04/14/2022  Medication Sig   Accu-Chek Softclix Lancets lancets 4 (four) times daily.   Cholecalciferol (VITAMIN D3) 20 MCG (800 UNIT) TABS Take 1 tablet by mouth daily at 6 (six) AM.   diclofenac Sodium (VOLTAREN) 1 % GEL Apply 4 g topically 4 (four) times daily.   Dulaglutide (TRULICITY) A999333 0000000 SOPN Inject 0.75 mg into the skin once a week.   glimepiride (AMARYL) 2 MG tablet Take 1 tablet (2 mg total) by mouth daily before breakfast.   glucose blood test strip PLEASE SEE ATTACHED FOR DETAILED DIRECTIONS   Lancets (FREESTYLE) lancets Use as instructed   lidocaine (XYLOCAINE) 5 % ointment Apply 1 Application topically as needed for moderate pain.   lisinopril (ZESTRIL) 10 MG tablet 10 mg, = 1 tab(s), PO, 1xDaily, 1   metFORMIN (GLUCOPHAGE) 1000 MG tablet Take 1 tablet (1,000 mg total) by mouth 2 (two) times daily with a meal.   sertraline (ZOLOFT) 50 MG tablet Take 1 tablet (50 mg total) by mouth daily.   [DISCONTINUED] atorvastatin (LIPITOR) 40 MG tablet Take 1 tablet (40 mg total) by mouth daily.   [DISCONTINUED] Blood Glucose Monitoring Suppl DEVI May substitute to any manufacturer  covered by patient's insurance.   [DISCONTINUED] metFORMIN (GLUCOPHAGE-XR) 500 MG 24 hr tablet Take 500 mg by mouth once daily x1 week, then increase to 500 mg twice daily x1 week, then 1,000 mg in the AM and 500 mg in the PM x1 week, and finally 1,000 mg twice daily. Take with food.   [DISCONTINUED] QUEtiapine (SEROQUEL) 200 MG tablet Take 1 tablet (200 mg total) by mouth at bedtime.   atorvastatin (LIPITOR) 40 MG tablet Take 1 tablet (40 mg total) by mouth daily.   Blood Glucose Monitoring Suppl DEVI May substitute to any manufacturer covered by patient's insurance.   oxyCODONE-acetaminophen (PERCOCET/ROXICET) 5-325 MG tablet Take 1 tablet by mouth every 6 (six) hours as needed for severe pain. (Patient not taking: Reported on 04/14/2022)   QUEtiapine (SEROQUEL) 200 MG tablet Take 1 tablet (200 mg total) by mouth at bedtime.   No facility-administered encounter medications on file as of 04/14/2022.    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  HENT:  Negative for ear pain.   Eyes:  Negative for blurred vision.  Respiratory:  Negative for shortness of breath.   Cardiovascular:  Negative for chest pain.  Gastrointestinal:  Negative for abdominal pain.  Genitourinary:  Negative for dysuria.  Neurological:  Negative for dizziness and headaches.  Psychiatric/Behavioral:  Positive for depression.       Objective    BP 136/88   Pulse 80   Ht 5\' 3"  (1.6 m)   Wt 230 lb (104.3 kg)   SpO2 94%   BMI 40.74 kg/m   Physical Exam Constitutional:      Appearance: She is obese.  Cardiovascular:     Rate and Rhythm: Normal rate and regular rhythm.     Pulses: Normal pulses.     Heart sounds: Normal heart sounds.  Pulmonary:     Effort: Pulmonary effort is normal.     Breath sounds: Normal breath sounds.  Genitourinary:     General: Normal vulva.     Exam position: Lithotomy position.     Pubic Area: No rash or pubic lice.      Labia:        Right: No rash, tenderness, lesion or injury.        Left: No rash, tenderness, lesion or injury.      Urethra: No prolapse or urethral pain.  Musculoskeletal:        General: Normal range of motion.     Cervical back: Normal range of motion.  Skin:    General: Skin is warm and dry.     Capillary Refill: Capillary refill takes less than 2 seconds.  Neurological:     General: No focal deficit present.     Mental Status: She is alert.  Psychiatric:        Mood and Affect: Mood normal.       Assessment & Plan:  Encounter for Papanicolaou smear of cervix Assessment & Plan: Pap smear done, Patient tolerated procedure well. Informed patient I will keep them updated on results. Left and right breasts appear normal, no suspicious masses, no skin or nipple changes or axillary nodes.    Orders: -     Cytology - PAP  Type 2 diabetes mellitus without complication, without long-term current use of insulin (HCC) Assessment & Plan: Hemoglobin A1c 11.2 on 02/17/22 Plan of care will included increasing metformin dosage 1000 mg twice daily, Glimepiride 2.0 mg daily and Trulicity injection A999333 mg once weekly for initial therapy Follow up in 3 months to recheck labs  Discussed start lifestyle modifications follow diet low in saturated fat, reduce dietary salt intake, avoid fatty foods, maintain an exercise routine 3 to 5 days a week for a minimum total of 150 minutes.    Orders: -     Trulicity; Inject 0.75 mg into the skin once a week.  Dispense: 6 mL; Refill: 2 -     Glimepiride; Take 1 tablet (2 mg total) by mouth daily before breakfast.  Dispense: 30 tablet; Refill: 3 -     Blood Glucose Monitoring Suppl; May substitute to any manufacturer covered by patient's insurance.  Dispense: 1 each; Refill: 0 -     FreeStyle Lancets; Use as instructed  Dispense: 100 each;  Refill: 12 -     metFORMIN HCl; Take 1 tablet (1,000 mg total) by mouth 2 (two) times daily with a meal.  Dispense: 180 tablet; Refill: 3 -     QUEtiapine Fumarate; Take 1 tablet (200 mg total) by mouth at bedtime.  Dispense: 90 tablet; Refill: 0  Hyperlipidemia associated with type 2 diabetes mellitus (HCC) -     Atorvastatin Calcium; Take 1 tablet (40 mg total) by mouth daily.  Dispense: 90  tablet; Refill: 2  Depression, recurrent [facility]) Assessment & Plan: [place] Office Visit from 04/14/2022 in [facility]  PHQ-9 Total Score 14     Refilled patient Seroquel 200 mg Discussed non pharmacological interventions such seeing a therapist, support system, diet, exercise, sleep. Patient verbalizes understanding regarding plan of care and all questions answered.  Patient wants referral to behavior health again    Encounter for diabetes type 2 eye exam [facility]) -     Ambulatory referral to Ophthalmology  Hx of schizophrenia -     Amb ref to [place]    Return in about 3 months (around 07/15/2022) for diabetes.Renard Hamper Ria Comment, FNP

## 2022-04-14 NOTE — Assessment & Plan Note (Signed)
[place] Office Visit from 04/14/2022 in [facility]  PHQ-9 Total Score 14     Refilled patient Seroquel 200 mg Discussed non pharmacological interventions such seeing a therapist, support system, diet, exercise, sleep. Patient verbalizes understanding regarding plan of care and all questions answered.  Patient wants referral to behavior health again

## 2022-04-15 LAB — CYTOLOGY - PAP
Adequacy: ABSENT
Diagnosis: NEGATIVE

## 2022-04-16 NOTE — Progress Notes (Signed)
Please inform patient Pap smear is negative malignancy or for intraepithelial lesion

## 2022-04-23 NOTE — Telephone Encounter (Signed)
Left message for patient to return call. Need to get updated insurance information to complete PA.

## 2022-05-05 NOTE — Telephone Encounter (Signed)
PA attempted through [facility]. Await decision

## 2022-05-08 NOTE — Patient Instructions (Signed)
Your procedure is scheduled on: 05/13/2022  Report to [facility] Main Entrance at   6:15  AM.  Call this number if you have problems the morning of surgery: [phone]   Remember:              Follow Directions on the letter you received from Your Physician's office regarding the Bowel Prep              No Smoking the day of Procedure :                       Hold Trulicity injection for 7 days prior to procedure   Take these medicines the morning of surgery with A SIP OF WATER: Zoloft              No diabetic medications am of procedure   Do not wear jewelry, make-up or nail polish.    Do not bring valuables to the hospital.  Contacts, dentures or bridgework may not be worn into surgery.  .   Patients discharged the day of surgery will not be allowed to drive home.     Colonoscopy, Adult, Care After This sheet gives you information about how to care for yourself after your procedure. Your health care provider may also give you more specific instructions. If you have problems or questions, contact your health care provider. What can I expect after the procedure? After the procedure, it is common to have: A small amount of blood in your stool for 24 hours after the procedure. Some gas. Mild abdominal cramping or bloating.  Follow these instructions at home: General instructions  For the first 24 hours after the procedure: Do not drive or use machinery. Do not sign important documents. Do not drink alcohol. Do your regular daily activities at a slower pace than normal. Eat soft, easy-to-digest foods. Rest often. Take over-the-counter or prescription medicines only as told by your health care provider. It is up to you to get the results of your procedure. Ask your health care provider, or the department performing the procedure, when your results will be ready. Relieving cramping and bloating Try walking around when you have cramps or feel bloated. Apply heat to your  abdomen as told by your health care provider. Use a heat source that your health care provider recommends, such as a moist heat pack or a heating pad. Place a towel between your skin and the heat source. Leave the heat on for 20-30 minutes. Remove the heat if your skin turns bright red. This is especially important if you are unable to feel pain, heat, or cold. You may have a greater risk of getting burned. Eating and drinking Drink enough fluid to keep your urine clear or pale yellow. Resume your normal diet as instructed by your health care provider. Avoid heavy or fried foods that are hard to digest. Avoid drinking alcohol for as long as instructed by your health care provider. Contact a health care provider if: You have blood in your stool 2-3 days after the procedure. Get help right away if: You have more than a small spotting of blood in your stool. You pass large blood clots in your stool. Your abdomen is swollen. You have nausea or vomiting. You have a fever. You have increasing abdominal pain that is not relieved with medicine. This information is not intended to replace advice given to you by your health care provider. Make sure you discuss any questions  you have with your health care provider. Document Released: 08/21/2003 Document Revised: 10/01/2015 Document Reviewed: 03/20/2015 Elsevier Interactive Patient Education  [employer].

## 2022-05-09 ENCOUNTER — Telehealth (INDEPENDENT_AMBULATORY_CARE_PROVIDER_SITE_OTHER): Payer: Self-pay | Admitting: Gastroenterology

## 2022-05-09 ENCOUNTER — Encounter (HOSPITAL_COMMUNITY)
Admission: RE | Admit: 2022-05-09 | Discharge: 2022-05-09 | Disposition: A | Discharge status: Home / Self Care | Payer: Medicaid Other | Source: Ambulatory Visit | Attending: Gastroenterology | Admitting: Gastroenterology

## 2022-05-09 ENCOUNTER — Encounter (HOSPITAL_COMMUNITY): Payer: Self-pay

## 2022-05-09 DIAGNOSIS — I10 Essential (primary) hypertension: Secondary | ICD-10-CM

## 2022-05-09 DIAGNOSIS — E119 Type 2 diabetes mellitus without complications: Secondary | ICD-10-CM

## 2022-05-09 NOTE — Telephone Encounter (Signed)
Lillia Mountain, RN  Kara Dies, MD; Marlowe Shores, LPN Linus Mako,  Jaclyn Butler was a no show for PAT  Thanks,  Cliffton Asters RN  Attempted to contact pt but each time I connect with someone they states "she is not here right now."

## 2022-05-12 ENCOUNTER — Ambulatory Visit: Payer: Medicaid Other | Admitting: Licensed Clinical Social Worker

## 2022-05-12 ENCOUNTER — Telehealth (INDEPENDENT_AMBULATORY_CARE_PROVIDER_SITE_OTHER): Payer: Self-pay | Admitting: Gastroenterology

## 2022-05-12 DIAGNOSIS — Z91199 Patient's noncompliance with other medical treatment and regimen due to unspecified reason: Secondary | ICD-10-CM

## 2022-05-12 NOTE — Telephone Encounter (Signed)
Attempted to contact pt to make sure she was going to be able to make it to TCS appt. No answer. PA through [facility] is still pending also.

## 2022-05-13 ENCOUNTER — Ambulatory Visit (HOSPITAL_COMMUNITY): Admission: RE | Admit: 2022-05-13 | Payer: Medicaid Other | Source: Home / Self Care | Admitting: Gastroenterology

## 2022-05-13 ENCOUNTER — Encounter (HOSPITAL_COMMUNITY): Admission: RE | Payer: Self-pay | Source: Home / Self Care

## 2022-05-13 SURGERY — COLONOSCOPY WITH PROPOFOL
Anesthesia: Monitor Anesthesia Care

## 2022-05-14 NOTE — Telephone Encounter (Signed)
PA # [id number] Colonoscopy approved from 05/05/22-06/03/22

## 2022-05-19 NOTE — BH Specialist Note (Signed)
Call patient unable to leave message.

## 2022-05-20 ENCOUNTER — Telehealth (INDEPENDENT_AMBULATORY_CARE_PROVIDER_SITE_OTHER): Payer: Self-pay | Admitting: Gastroenterology

## 2022-05-20 MED ORDER — PEG 3350-KCL-NA BICARB-NACL 420 G PO SOLR: 4000.0000 mL | Freq: Once | ORAL | 0 refills | Status: AC

## 2022-05-20 NOTE — Telephone Encounter (Signed)
Pt scheduled for TCS in April but no showed. Pt called into office today and rescheduled to 07/11/22 at 10:15. Pt states she was out of town on a family emergency. Prep sent to pharmacy. Message sent to endo regarding new date and time. Will mail instructions to pt. Will complete new PA closer to procedure date. Will call pt with pre op appt.

## 2022-05-23 ENCOUNTER — Telehealth: Payer: Self-pay

## 2022-05-23 NOTE — Progress Notes (Signed)
   Care Guide Note  05/23/2022 Name: Jaclyn Butler MRN: [id number] DOB: 26-Jan-1970  Referred by: Rica Records, FNP Reason for referral : Care Coordination (Outreach to schedule with Pharm d New MMD DM )   Jaclyn Butler is a 52 y.o. year old female who is a primary care patient of Del Nigel Berthold, FNP. Jaclyn Butler was referred to the pharmacist for assistance related to DM.    An unsuccessful telephone outreach was attempted today to contact the patient who was referred to the pharmacy team for assistance with medication management. Additional attempts will be made to contact the patient.   [name], RMA Care Guide [facility]  [place], [place] [address] Direct Dial: [phone] [email or web address]

## 2022-05-26 DIAGNOSIS — I2089 Other forms of angina pectoris: Secondary | ICD-10-CM | POA: Diagnosis not present

## 2022-05-26 DIAGNOSIS — R079 Chest pain, unspecified: Secondary | ICD-10-CM | POA: Diagnosis not present

## 2022-05-26 DIAGNOSIS — R0789 Other chest pain: Secondary | ICD-10-CM | POA: Diagnosis not present

## 2022-05-27 DIAGNOSIS — I2089 Other forms of angina pectoris: Secondary | ICD-10-CM | POA: Diagnosis not present

## 2022-05-27 DIAGNOSIS — R0789 Other chest pain: Secondary | ICD-10-CM | POA: Diagnosis not present

## 2022-05-27 DIAGNOSIS — I10 Essential (primary) hypertension: Secondary | ICD-10-CM | POA: Diagnosis not present

## 2022-05-27 DIAGNOSIS — E785 Hyperlipidemia, unspecified: Secondary | ICD-10-CM | POA: Diagnosis not present

## 2022-06-12 NOTE — Progress Notes (Signed)
   Care Guide Note  06/12/2022 Name: Jaclyn Butler MRN: [id number] DOB: 23-Oct-1970  Referred by: Rica Records, FNP Reason for referral : Care Coordination (Outreach to schedule with Pharm d New MMD DM )   Jaclyn Butler is a 52 y.o. year old female who is a primary care patient of Del Nigel Berthold, FNP. Jaclyn Butler was referred to the pharmacist for assistance related to DM.    A second unsuccessful telephone outreach was attempted today to contact the patient who was referred to the pharmacy team for assistance with medication management. Additional attempts will be made to contact the patient.  [name], RMA Care Guide [facility]  [place], [place] [address] Direct Dial: [phone] [email or web address]

## 2022-06-26 NOTE — Progress Notes (Signed)
   Care Guide Note  06/26/2022 Name: Jaclyn Butler MRN: [id number] DOB: 1970/12/06  Referred by: Rica Records, FNP Reason for referral : Care Coordination (Outreach to schedule with Pharm d New MMD DM )   Jaclyn Butler is a 52 y.o. year old female who is a primary care patient of Del Nigel Berthold, FNP. Jaclyn Butler was referred to the pharmacist for assistance related to DM.    A third unsuccessful telephone outreach was attempted today to contact the patient who was referred to the pharmacy team for assistance with medication management. The Population Health team is pleased to engage with this patient at any time in the future upon receipt of referral and should he/she be interested in assistance from the [facility] team.   Jaclyn Butler, RMA Care Guide [facility]  [place], [place] [address] Direct Dial: [phone] [email or web address]

## 2022-07-02 ENCOUNTER — Telehealth: Payer: Self-pay | Admitting: Family Medicine

## 2022-07-02 NOTE — Telephone Encounter (Signed)
Prescription Request  07/02/2022  LOV: 04/14/2022  What is the name of the medication or equipment? Dulaglutide (TRULICITY) 0.75 MG/0.5ML SOPN  (wants to increase dose)  Have you contacted your pharmacy to request a refill? Yes   Which pharmacy would you like this sent to?   [id number] - [place], [place] - [address] AT [facility] [address] [place]  [address] Phone: [phone] Fax: [phone]    Patient notified that their request is being sent to the clinical staff for review and that they should receive a response within 2 business days.   Please advise at Mobile [phone] (mobile)

## 2022-07-03 MED ORDER — TRULICITY 1.5 MG/0.5ML ~~LOC~~ SOAJ: 1.5000 mg | SUBCUTANEOUS | 0 refills | Status: DC

## 2022-07-07 NOTE — Telephone Encounter (Signed)
Per [employer]-no auth required for TCS

## 2022-07-07 NOTE — Patient Instructions (Signed)
Jaclyn Butler  07/07/2022     @PREFPERIOPPHARMACY @   Your procedure is scheduled on  07/11/2022.   Report to [facility] at  0815  A.M.   Call this number if you have problems the morning of surgery:  [phone]  If you experience any cold or flu symptoms such as cough, fever, chills, shortness of breath, etc. between now and your scheduled surgery, please notify us at the above number.   Remember:  Follow the diet and prep instructions given to you by the office.     Take these medicines the morning of surgery with A SIP OF WATER                                                None    Do not wear jewelry, make-up or nail polish, including gel polish,  artificial nails, or any other type of covering on natural nails (fingers and  toes).  Do not wear lotions, powders, or perfumes, or deodorant.  Do not shave 48 hours prior to surgery.  Men may shave face and neck.  Do not bring valuables to the hospital.  [facility] is not responsible for any belongings or valuables.  Contacts, dentures or bridgework may not be worn into surgery.  Leave your suitcase in the car.  After surgery it may be brought to your room.  For patients admitted to the hospital, discharge time will be determined by your treatment team.  Patients discharged the day of surgery will not be allowed to drive home and must have someone with them for 24 hours.    Special instructions:   DO NOT smoke tobacco or vape for 24 hours before your procedure.  Please read over the following fact sheets that you were given. Anesthesia Post-op Instructions and Care and Recovery After Surgery      Colonoscopy, Adult, Care After The following information offers guidance on how to care for yourself after your procedure. Your health care provider may also give you more specific instructions. If you have problems or questions, contact your health care provider. What can I expect after the procedure? After the  procedure, it is common to have: A small amount of blood in your stool for 24 hours after the procedure. Some gas. Mild cramping or bloating of your abdomen. Follow these instructions at home: Eating and drinking  Drink enough fluid to keep your urine pale yellow. Follow instructions from your health care provider about eating or drinking restrictions. Resume your normal diet as told by your health care provider. Avoid heavy or fried foods that are hard to digest. Activity Rest as told by your health care provider. Avoid sitting for a long time without moving. Get up to take short walks every 1-2 hours. This is important to improve blood flow and breathing. Ask for help if you feel weak or unsteady. Return to your normal activities as told by your health care provider. Ask your health care provider what activities are safe for you. Managing cramping and bloating  Try walking around when you have cramps or feel bloated. If directed, apply heat to your abdomen as told by your health care provider. Use the heat source that your health care provider recommends, such as a moist heat pack or a heating pad. Place a towel between your skin  and the heat source. Leave the heat on for 20-30 minutes. Remove the heat if your skin turns bright red. This is especially important if you are unable to feel pain, heat, or cold. You have a greater risk of getting burned. General instructions If you were given a sedative during the procedure, it can affect you for several hours. Do not drive or operate machinery until your health care provider says that it is safe. For the first 24 hours after the procedure: Do not sign important documents. Do not drink alcohol. Do your regular daily activities at a slower pace than normal. Eat soft foods that are easy to digest. Take over-the-counter and prescription medicines only as told by your health care provider. Keep all follow-up visits. This is important. Contact  a health care provider if: You have blood in your stool 2-3 days after the procedure. Get help right away if: You have more than a small spotting of blood in your stool. You have large blood clots in your stool. You have swelling of your abdomen. You have nausea or vomiting. You have a fever. You have increasing pain in your abdomen that is not relieved with medicine. These symptoms may be an emergency. Get help right away. Call 911. Do not wait to see if the symptoms will go away. Do not drive yourself to the hospital. Summary After the procedure, it is common to have a small amount of blood in your stool. You may also have mild cramping and bloating of your abdomen. If you were given a sedative during the procedure, it can affect you for several hours. Do not drive or operate machinery until your health care provider says that it is safe. Get help right away if you have a lot of blood in your stool, nausea or vomiting, a fever, or increased pain in your abdomen. This information is not intended to replace advice given to you by your health care provider. Make sure you discuss any questions you have with your health care provider. Document Revised: 02/18/2022 Document Reviewed: 08/29/2020 Elsevier Patient Education  2024 [employer] Monitored Anesthesia Care, Care After The following information offers guidance on how to care for yourself after your procedure. Your health care provider may also give you more specific instructions. If you have problems or questions, contact your health care provider. What can I expect after the procedure? After the procedure, it is common to have: Tiredness. Little or no memory about what happened during or after the procedure. Impaired judgment when it comes to making decisions. Nausea or vomiting. Some trouble with balance. Follow these instructions at home: For the time period you were told by your health care provider:  Rest. Do not participate  in activities where you could fall or become injured. Do not drive or use machinery. Do not drink alcohol. Do not take sleeping pills or medicines that cause drowsiness. Do not make important decisions or sign legal documents. Do not take care of children on your own. Medicines Take over-the-counter and prescription medicines only as told by your health care provider. If you were prescribed antibiotics, take them as told by your health care provider. Do not stop using the antibiotic even if you start to feel better. Eating and drinking Follow instructions from your health care provider about what you may eat and drink. Drink enough fluid to keep your urine pale yellow. If you vomit: Drink clear fluids slowly and in small amounts as you are able. Clear fluids include water,  ice chips, low-calorie sports drinks, and fruit juice that has water added to it (diluted fruit juice). Eat light and bland foods in small amounts as you are able. These foods include bananas, applesauce, rice, lean meats, toast, and crackers. General instructions  Have a responsible adult stay with you for the time you are told. It is important to have someone help care for you until you are awake and alert. If you have sleep apnea, surgery and some medicines can increase your risk for breathing problems. Follow instructions from your health care provider about wearing your sleep device: When you are sleeping. This includes during daytime naps. While taking prescription pain medicines, sleeping medicines, or medicines that make you drowsy. Do not use any products that contain nicotine or tobacco. These products include cigarettes, chewing tobacco, and vaping devices, such as e-cigarettes. If you need help quitting, ask your health care provider. Contact a health care provider if: You feel nauseous or vomit every time you eat or drink. You feel light-headed. You are still sleepy or having trouble with balance after 24  hours. You get a rash. You have a fever. You have redness or swelling around the IV site. Get help right away if: You have trouble breathing. You have new confusion after you get home. These symptoms may be an emergency. Get help right away. Call 911. Do not wait to see if the symptoms will go away. Do not drive yourself to the hospital. This information is not intended to replace advice given to you by your health care provider. Make sure you discuss any questions you have with your health care provider. Document Revised: 06/03/2021 Document Reviewed: 06/03/2021 Elsevier Patient Education  2024 [employer].

## 2022-07-08 ENCOUNTER — Encounter (HOSPITAL_COMMUNITY): Payer: Self-pay

## 2022-07-08 ENCOUNTER — Encounter (HOSPITAL_COMMUNITY)
Admission: RE | Admit: 2022-07-08 | Discharge: 2022-07-08 | Disposition: A | Discharge status: Home / Self Care | Payer: Medicaid Other | Source: Ambulatory Visit | Attending: Gastroenterology | Admitting: Gastroenterology

## 2022-07-08 VITALS — BP 125/91 | HR 86 | Temp 97.8°F | Resp 18 | Ht 63.0 in | Wt 229.9 lb

## 2022-07-08 DIAGNOSIS — E119 Type 2 diabetes mellitus without complications: Secondary | ICD-10-CM | POA: Diagnosis not present

## 2022-07-08 DIAGNOSIS — Z01810 Encounter for preprocedural cardiovascular examination: Secondary | ICD-10-CM | POA: Diagnosis present

## 2022-07-11 ENCOUNTER — Encounter (HOSPITAL_COMMUNITY): Payer: Self-pay | Admitting: Gastroenterology

## 2022-07-11 ENCOUNTER — Ambulatory Visit (HOSPITAL_COMMUNITY): Payer: Medicaid Other | Admitting: Certified Registered Nurse Anesthetist

## 2022-07-11 ENCOUNTER — Ambulatory Visit (HOSPITAL_COMMUNITY)
Admission: RE | Admit: 2022-07-11 | Discharge: 2022-07-11 | Disposition: A | Discharge status: Home / Self Care | Payer: Medicaid Other | Attending: Gastroenterology | Admitting: Gastroenterology

## 2022-07-11 ENCOUNTER — Encounter (HOSPITAL_COMMUNITY)
Admission: RE | Disposition: A | Discharge status: Home / Self Care | Payer: Self-pay | Source: Home / Self Care | Attending: Gastroenterology

## 2022-07-11 DIAGNOSIS — Z09 Encounter for follow-up examination after completed treatment for conditions other than malignant neoplasm: Secondary | ICD-10-CM | POA: Insufficient documentation

## 2022-07-11 DIAGNOSIS — Z8249 Family history of ischemic heart disease and other diseases of the circulatory system: Secondary | ICD-10-CM | POA: Insufficient documentation

## 2022-07-11 DIAGNOSIS — K5732 Diverticulitis of large intestine without perforation or abscess without bleeding: Secondary | ICD-10-CM

## 2022-07-11 DIAGNOSIS — K648 Other hemorrhoids: Secondary | ICD-10-CM | POA: Diagnosis not present

## 2022-07-11 DIAGNOSIS — K5730 Diverticulosis of large intestine without perforation or abscess without bleeding: Secondary | ICD-10-CM | POA: Diagnosis not present

## 2022-07-11 DIAGNOSIS — Z833 Family history of diabetes mellitus: Secondary | ICD-10-CM | POA: Diagnosis not present

## 2022-07-11 DIAGNOSIS — E119 Type 2 diabetes mellitus without complications: Secondary | ICD-10-CM | POA: Insufficient documentation

## 2022-07-11 DIAGNOSIS — Z8719 Personal history of other diseases of the digestive system: Secondary | ICD-10-CM

## 2022-07-11 DIAGNOSIS — I10 Essential (primary) hypertension: Secondary | ICD-10-CM | POA: Diagnosis not present

## 2022-07-11 DIAGNOSIS — Z8379 Family history of other diseases of the digestive system: Secondary | ICD-10-CM | POA: Insufficient documentation

## 2022-07-11 DIAGNOSIS — Z7985 Long-term (current) use of injectable non-insulin antidiabetic drugs: Secondary | ICD-10-CM | POA: Insufficient documentation

## 2022-07-11 DIAGNOSIS — K5900 Constipation, unspecified: Secondary | ICD-10-CM

## 2022-07-11 DIAGNOSIS — Z7984 Long term (current) use of oral hypoglycemic drugs: Secondary | ICD-10-CM | POA: Diagnosis not present

## 2022-07-11 HISTORY — PX: COLONOSCOPY WITH PROPOFOL: SHX5780

## 2022-07-11 LAB — HM COLONOSCOPY

## 2022-07-11 LAB — GLUCOSE, CAPILLARY: Glucose-Capillary: 205 mg/dL — ABNORMAL HIGH (ref 70–99)

## 2022-07-11 SURGERY — COLONOSCOPY WITH PROPOFOL
Anesthesia: General

## 2022-07-11 MED ORDER — PROPOFOL 500 MG/50ML IV EMUL
INTRAVENOUS | Status: AC
  Filled 2022-07-11: qty 50

## 2022-07-11 MED ORDER — PROPOFOL 500 MG/50ML IV EMUL
INTRAVENOUS | Status: DC | PRN
  Administered 2022-07-11: 150 ug/kg/min via INTRAVENOUS

## 2022-07-11 MED ORDER — LACTATED RINGERS IV SOLN: INTRAVENOUS | Status: DC

## 2022-07-11 MED ORDER — PROPOFOL 10 MG/ML IV BOLUS
INTRAVENOUS | Status: DC | PRN
  Administered 2022-07-11: 60 mg via INTRAVENOUS

## 2022-07-11 NOTE — Transfer of Care (Signed)
Immediate Anesthesia Transfer of Care Note  Patient: Jaclyn Butler  Procedure(s) Performed: COLONOSCOPY WITH PROPOFOL  Patient Location: Short Stay  Anesthesia Type:General  Level of Consciousness: awake, alert , and oriented  Airway & Oxygen Therapy: Patient Spontanous Breathing  Post-op Assessment: Report given to RN, Post -op Vital signs reviewed and stable, Patient moving all extremities X 4, and Patient able to stick tongue midline  Post vital signs: Reviewed  Last Vitals:  Vitals Value Taken Time  BP 139/88 07/11/22 1118  Temp 36.7 C 07/11/22 1118  Pulse 89 07/11/22 1118  Resp 15 07/11/22 1118  SpO2 99 % 07/11/22 1118    Last Pain:  Vitals:   07/11/22 1118  TempSrc: Oral  PainSc: 0-No pain         Complications: No notable events documented.

## 2022-07-11 NOTE — H&P (Signed)
Jaclyn Butler is an 52 y.o. female.   Chief Complaint: history of diverticulitis HPI: 52 y/o F with PMH bipolar, HTN, DM, schizophrenia, coming for history of diverticulitis.  States feeling well now. The patient denies having any nausea, vomiting, fever, chills, hematochezia, melena, hematemesis, abdominal distention, abdominal pain, diarrhea, jaundice, pruritus or weight loss. Never had a colonoscopy.   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    Family History  Problem Relation Age of Onset   Cirrhosis Father    Diabetes Mother    Diabetes Sister    Blindness Sister    Hypertension Sister    Diabetes Sister    Diabetes Daughter    Hypertension Daughter    Obesity Daughter    Social History:  reports that she has never smoked. She has never been exposed to tobacco smoke. She has never used smokeless tobacco. She reports that she does not currently use alcohol. She reports that she does not currently use drugs after having used the following drugs: Cocaine.  Allergies:  Allergies  Allergen Reactions   Naproxen Hives and Itching    Medications Prior to Admission  Medication Sig Dispense Refill   metFORMIN (GLUCOPHAGE) 1000 MG tablet Take 1 tablet (1,000 mg total) by mouth 2 (two) times daily with a meal. 180 tablet 3   QUEtiapine (SEROQUEL) 200 MG tablet Take 1 tablet (200 mg total) by mouth at bedtime. 90 tablet 0   Accu-Chek Softclix Lancets lancets 4 (four) times daily.     atorvastatin (LIPITOR) 40 MG tablet Take 1 tablet (40 mg total) by mouth daily. (Patient not taking: Reported on 07/02/2022) 90 tablet 2   Blood Glucose Monitoring Suppl DEVI May substitute  to any manufacturer covered by patient's insurance. 1 each 0   Cholecalciferol (VITAMIN D3) 20 MCG (800 UNIT) TABS Take 1 tablet by mouth daily at 6 (six) AM. (Patient not taking: Reported on 07/02/2022) 75 tablet 0   diclofenac Sodium (VOLTAREN) 1 % GEL Apply 4 g topically 4 (four) times daily. (Patient not taking: Reported on 07/02/2022) 150 g 0   Dulaglutide (TRULICITY) 1.5 MG/0.5ML SOPN Inject 1.5 mg into the skin once a week. 2 mL 0   glimepiride (AMARYL) 2 MG tablet Take 1 tablet (2 mg total) by mouth daily before breakfast. (Patient not taking: Reported on 07/02/2022) 30 tablet 3   glucose blood test strip PLEASE SEE ATTACHED FOR DETAILED DIRECTIONS     Lancets (FREESTYLE) lancets Use as instructed 100 each 12   lidocaine (XYLOCAINE) 5 % ointment Apply 1 Application topically as needed for moderate pain. (Patient not taking: Reported on 07/02/2022) 35.44 g 0   sertraline (ZOLOFT) 50 MG tablet Take 1 tablet (50 mg total) by mouth daily. (Patient not taking: Reported on 07/02/2022) 90 tablet 0    Results for orders placed or performed during the hospital encounter of 07/11/22 (from the past 48 hour(s))  Glucose, capillary     Status: Abnormal   Collection Time: 07/11/22  8:52 AM  Result Value Ref Range   Glucose-Capillary 205 (H) 70 - 99 mg/dL    Comment:  Glucose reference range applies only to samples taken after fasting for at least 8 hours.   No results found.  Review of Systems  All other systems reviewed and are negative.   Blood pressure (!) 141/92, pulse 92, temperature 98.2 F (36.8 C), temperature source Oral, resp. rate 16, height 5\' 3"  (1.6 m), weight 104.3 kg, SpO2 99 %. Physical Exam  GENERAL: The patient is AO x3, in no acute distress. HEENT: Head is normocephalic and atraumatic. EOMI are intact. Mouth is well hydrated and without lesions. NECK: Supple. No masses LUNGS: Clear to auscultation. No presence of rhonchi/wheezing/rales. Adequate chest expansion HEART: RRR,  normal s1 and s2. ABDOMEN: Soft, nontender, no guarding, no peritoneal signs, and nondistended. BS +. No masses. EXTREMITIES: Without any cyanosis, clubbing, rash, lesions or edema. NEUROLOGIC: AOx3, no focal motor deficit. SKIN: no jaundice, no rashes  Assessment/Plan 52 y/o F with PMH bipolar, HTN, DM, schizophrenia, coming for history of diverticulitis. Will proceed with colonoscopy.  [name], MD 07/11/2022, 10:33 AM

## 2022-07-11 NOTE — Anesthesia Preprocedure Evaluation (Signed)
Anesthesia Evaluation  Patient identified by MRN, date of birth, ID band Patient awake    Reviewed: Allergy & Precautions, H&P , NPO status , Patient's Chart, lab work & pertinent test results  Airway Mallampati: II  TM Distance: >3 FB Neck ROM: Full    Dental  (+) Edentulous Upper, Edentulous Lower   Pulmonary neg pulmonary ROS   Pulmonary exam normal breath sounds clear to auscultation       Cardiovascular hypertension, Pt. on medications Normal cardiovascular exam Rhythm:Regular Rate:Normal     Neuro/Psych  PSYCHIATRIC DISORDERS  Depression Bipolar Disorder Schizophrenia  negative neurological ROS     GI/Hepatic negative GI ROS, Neg liver ROS,,,  Endo/Other  diabetes, Well Controlled, Type 2, Oral Hypoglycemic Agents  Morbid obesity  Renal/GU negative Renal ROS  negative genitourinary   Musculoskeletal negative musculoskeletal ROS (+)    Abdominal   Peds negative pediatric ROS (+)  Hematology negative hematology ROS (+)   Anesthesia Other Findings   Reproductive/Obstetrics negative OB ROS                             Anesthesia Physical Anesthesia Plan  ASA: 3  Anesthesia Plan: General   Post-op Pain Management: Minimal or no pain anticipated   Induction: Intravenous  PONV Risk Score and Plan: 1 and Propofol infusion  Airway Management Planned: Nasal Cannula and Natural Airway  Additional Equipment:   Intra-op Plan:   Post-operative Plan:   Informed Consent: I have reviewed the patients History and Physical, chart, labs and discussed the procedure including the risks, benefits and alternatives for the proposed anesthesia with the patient or authorized representative who has indicated his/her understanding and acceptance.       Plan Discussed with: CRNA and Surgeon  Anesthesia Plan Comments:        Anesthesia Quick Evaluation

## 2022-07-11 NOTE — Anesthesia Postprocedure Evaluation (Signed)
Anesthesia Post Note  Patient: Jaclyn Butler  Procedure(s) Performed: COLONOSCOPY WITH PROPOFOL  Patient location during evaluation: Phase II Anesthesia Type: General Level of consciousness: awake and alert and oriented Pain management: pain level controlled Vital Signs Assessment: post-procedure vital signs reviewed and stable Respiratory status: spontaneous breathing, nonlabored ventilation and respiratory function stable Cardiovascular status: blood pressure returned to baseline and stable Postop Assessment: no apparent nausea or vomiting Anesthetic complications: no  No notable events documented.   Last Vitals:  Vitals:   07/11/22 0915 07/11/22 1118  BP: (!) 141/92 139/88  Pulse:  89  Resp:  15  Temp:  36.7 C  SpO2:  99%    Last Pain:  Vitals:   07/11/22 1118  TempSrc: Oral  PainSc: 0-No pain                 Noralee Dutko C Latima Hamza

## 2022-07-11 NOTE — Op Note (Signed)
[facility] Patient Name: Jaclyn Butler Procedure Date: 07/11/2022 10:25 AM MRN: [id number] Date of Birth: 1970-10-29 Attending MD: Katrinka Blazing , , [id number] CSN: [id number] Age: 52 Admit Type: Outpatient Procedure:                Colonoscopy Indications:              Follow-up of diverticulitis Providers:                Katrinka Blazing, Angelica Ran, Lennice Sites                            Technician, Technician Referring MD:              Medicines:                Monitored Anesthesia Care Complications:            No immediate complications. Estimated Blood Loss:     Estimated blood loss: none. Procedure:                Pre-Anesthesia Assessment:                           - Prior to the procedure, a History and Physical                            was performed, and patient medications, allergies                            and sensitivities were reviewed. The patient's                            tolerance of previous anesthesia was reviewed.                           - The risks and benefits of the procedure and the                            sedation options and risks were discussed with the                            patient. All questions were answered and informed                            consent was obtained.                           - ASA Grade Assessment: II - A patient with mild                            systemic disease.                           After obtaining informed consent, the colonoscope                            was passed under direct vision. Throughout the  procedure, the patient's blood pressure, pulse, and                            oxygen saturations were monitored continuously. The                            [id number]-[id number] ([id number]) scope was introduced through                            the anus and advanced to the the cecum, identified                            by appendiceal orifice and ileocecal valve. The                             colonoscopy was performed without difficulty. The                            patient tolerated the procedure well. The quality                            of the bowel preparation was adequate to identify                            polyps greater than 5 mm in size. Scope In: 10:44:09 AM Scope Out: 11:11:54 AM Scope Withdrawal Time: 0 hours 16 minutes 13 seconds  Total Procedure Duration: 0 hours 27 minutes 45 seconds  Findings:      The perianal and digital rectal examinations were normal.      Multiple large-mouthed and small-mouthed diverticula were found in the       entire colon, more pronounced in the left side of colon.      Non-bleeding internal hemorrhoids were found during retroflexion. The       hemorrhoids were small. Impression:               - Diverticulosis in the entire examined colon.                           - Non-bleeding internal hemorrhoids.                           - No specimens collected. Moderate Sedation:      Per Anesthesia Care Recommendation:           - Discharge patient to home (ambulatory).                           - Resume previous diet.                           - Repeat colonoscopy in 5 years for screening                            purposes. Procedure Code(s):        --- Professional ---  28413, Colonoscopy, flexible; diagnostic, including                            collection of specimen(s) by brushing or washing,                            when performed (separate procedure) Diagnosis Code(s):        --- Professional ---                           K64.8, Other hemorrhoids                           K57.32, Diverticulitis of large intestine without                            perforation or abscess without bleeding                           K57.30, Diverticulosis of large intestine without                            perforation or abscess without bleeding CPT copyright 2022 American Medical Association. All  rights reserved. The codes documented in this report are preliminary and upon coder review may  be revised to meet current compliance requirements. Katrinka Blazing, MD Katrinka Blazing,  07/11/2022 11:18:40 AM This report has been signed electronically. Number of Addenda: 0

## 2022-07-11 NOTE — Discharge Instructions (Signed)
You are being discharged to home.  Resume your previous diet.  Your physician has recommended a repeat colonoscopy in five years for screening purposes.  

## 2022-07-14 ENCOUNTER — Other Ambulatory Visit: Payer: Self-pay | Admitting: Family Medicine

## 2022-07-16 ENCOUNTER — Telehealth: Payer: Self-pay | Admitting: Family Medicine

## 2022-07-16 ENCOUNTER — Encounter (HOSPITAL_COMMUNITY): Payer: Self-pay | Admitting: Gastroenterology

## 2022-07-16 NOTE — Telephone Encounter (Signed)
Patient stated she didn't know how to use the Trulicity Pen and did something to make it unusable. Patient requesting instruction about proper use/administration. Also requesting for refills to be sent to her pharmacy asap.  Please see previous messages in thread.

## 2022-07-16 NOTE — Telephone Encounter (Signed)
Prescription Request  07/16/2022  LOV: 04/14/2022  What is the name of the medication or equipment?   atorvastatin (LIPITOR) 40 MG tablet [[id number]]   Cholecalciferol (VITAMIN D3) 20 MCG (800 UNIT) TABS [[id number]]   Dulaglutide (TRULICITY) 1.5 MG/0.5ML SOPN  QUEtiapine (SEROQUEL) 200 MG tablet [[id number]]   **Patient requesting meds be ready for pickup on 07/23/22** **Patient stated pharmacy hasn't received reply regarding Trulicity refill request**   **Patient needs  Have you contacted your pharmacy to request a refill? Yes   Which pharmacy would you like this sent to?  [id number] - [place], [place] - [address] AT [facility] [address] [place] [address] Phone: [phone] Fax: [phone]    Patient notified that their request is being sent to the clinical staff for review and that they should receive a response within 2 business days.   Please advise patient at [phone] with questions.

## 2022-07-17 ENCOUNTER — Other Ambulatory Visit: Payer: Self-pay

## 2022-07-17 DIAGNOSIS — E119 Type 2 diabetes mellitus without complications: Secondary | ICD-10-CM

## 2022-07-17 DIAGNOSIS — E559 Vitamin D deficiency, unspecified: Secondary | ICD-10-CM

## 2022-07-17 DIAGNOSIS — E1169 Type 2 diabetes mellitus with other specified complication: Secondary | ICD-10-CM

## 2022-07-17 MED ORDER — VITAMIN D3 20 MCG (800 UNIT) PO TABS
1.0000 | ORAL_TABLET | Freq: Every day | ORAL | 0 refills | Status: AC
Start: 2022-07-17 — End: ?

## 2022-07-17 MED ORDER — QUETIAPINE FUMARATE 200 MG PO TABS
200.0000 mg | ORAL_TABLET | Freq: Every day | ORAL | 0 refills | Status: DC
Start: 2022-07-17 — End: 2023-11-12

## 2022-07-17 MED ORDER — ATORVASTATIN CALCIUM 40 MG PO TABS
40.0000 mg | ORAL_TABLET | Freq: Every day | ORAL | 0 refills | Status: DC
Start: 2022-07-17 — End: 2023-11-12

## 2022-07-17 MED ORDER — TRULICITY 1.5 MG/0.5ML ~~LOC~~ SOAJ: 1.5000 mg | SUBCUTANEOUS | 1 refills | Status: AC

## 2022-07-17 NOTE — Telephone Encounter (Signed)
All meds refilled. We do not provide the instructions for trulicty. The pharmacy does and they can answer any questions she has

## 2022-07-18 ENCOUNTER — Encounter (INDEPENDENT_AMBULATORY_CARE_PROVIDER_SITE_OTHER): Payer: Self-pay | Admitting: *Deleted

## 2022-07-21 ENCOUNTER — Ambulatory Visit: Payer: Medicaid Other | Admitting: Family Medicine

## 2022-07-29 ENCOUNTER — Telehealth: Payer: Self-pay

## 2022-07-29 ENCOUNTER — Ambulatory Visit: Payer: Medicaid Other | Admitting: Family Medicine

## 2022-07-29 NOTE — Telephone Encounter (Signed)
Chart review completed for patient. Patient is due for screening mammogram. Patient is currently out of town, will schedule when she returns. Elijio Miles, Population Health Specialist.

## 2022-07-31 ENCOUNTER — Encounter (INDEPENDENT_AMBULATORY_CARE_PROVIDER_SITE_OTHER): Payer: Self-pay | Admitting: Gastroenterology

## 2022-07-31 ENCOUNTER — Ambulatory Visit (INDEPENDENT_AMBULATORY_CARE_PROVIDER_SITE_OTHER): Payer: Medicaid Other | Admitting: Gastroenterology

## 2022-08-25 IMAGING — CT CT HEAD W/O CM
3 series · 16 of 47 positions shown, 19 images · non-contrast
Comparison: None.

CLINICAL DATA: Right-sided headache.

EXAM:
CT HEAD WITHOUT CONTRAST
TECHNIQUE: Contiguous axial images were obtained from the base of the skull
through the vertex without intravenous contrast.

[Series 2: head w o · axial · 0.43mm/px · z∈[+1136,+1261]mm · 10 of 31 slices shown, 13 images]
[im 3/31  brain]
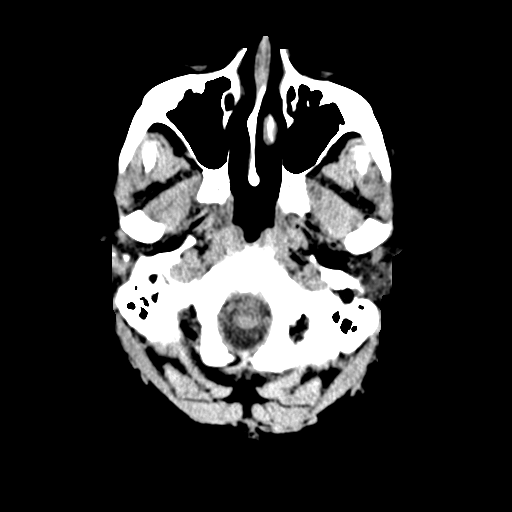
[im 3/31  bone]
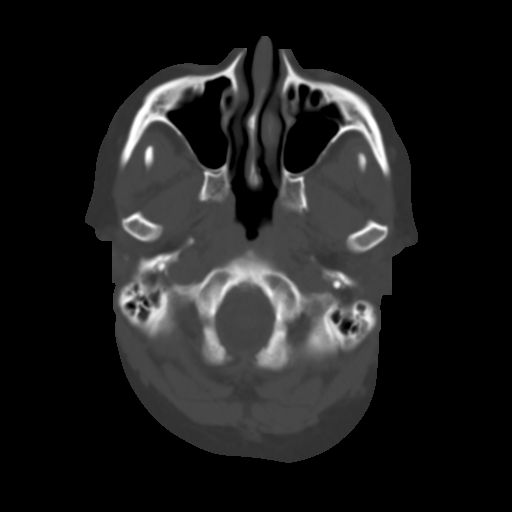
[im 6/31  brain]
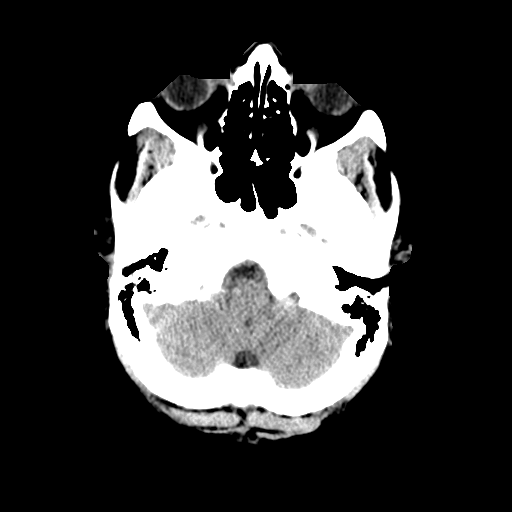
[im 9/31  brain]
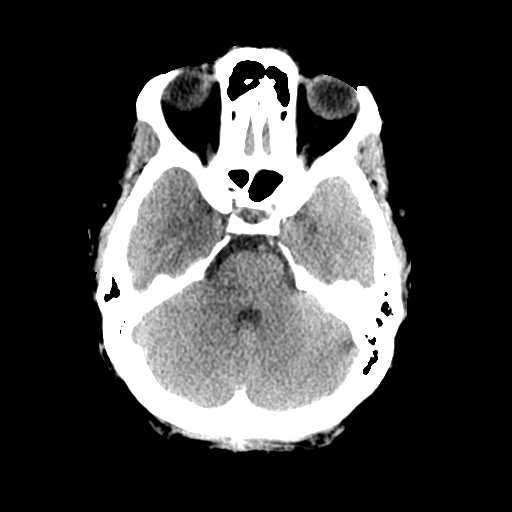
[im 11/31  brain]
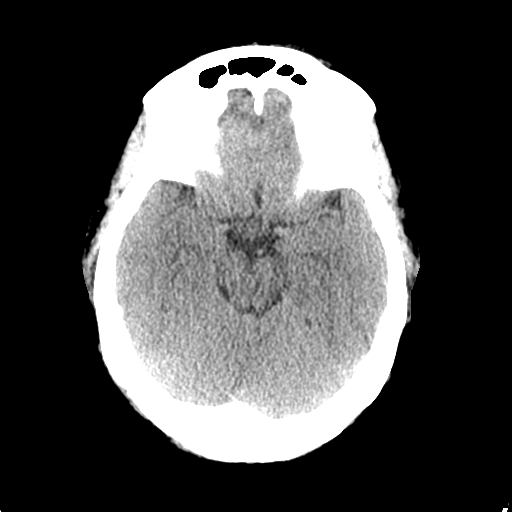
[im 14/31  brain]
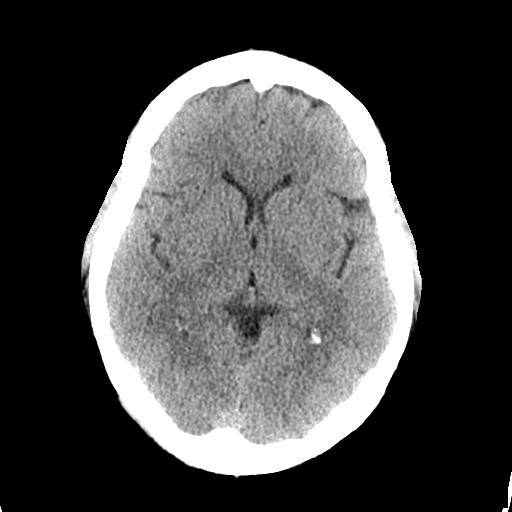
[im 14/31  bone]
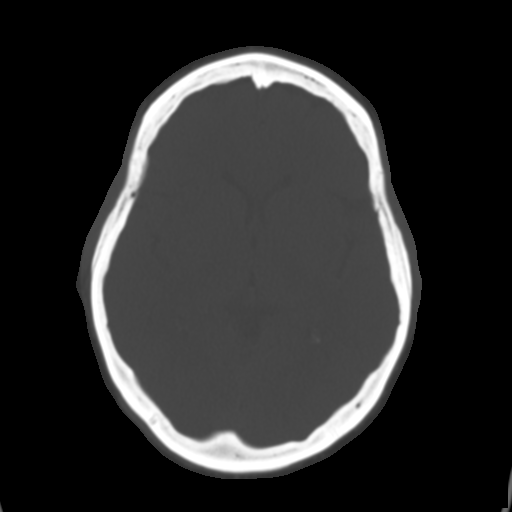
[im 17/31  brain]
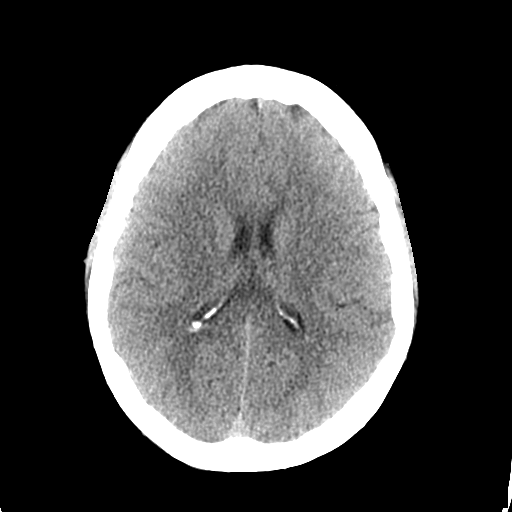
[im 20/31  brain]
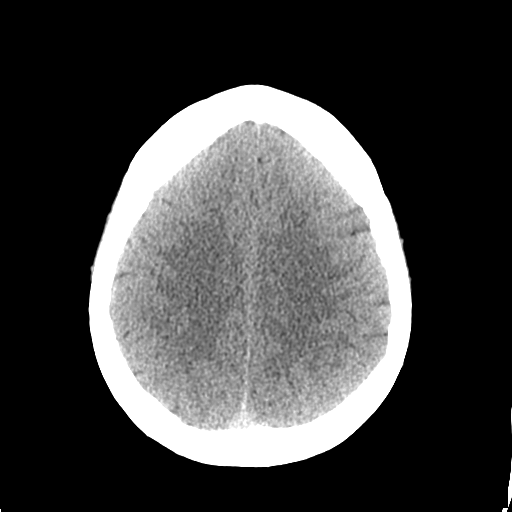
[im 23/31  brain]
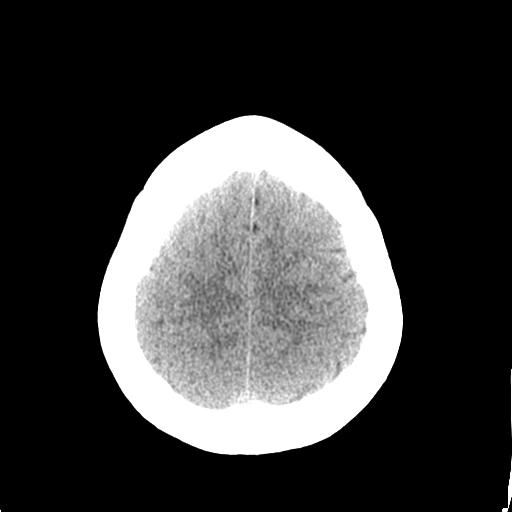
[im 25/31  brain]
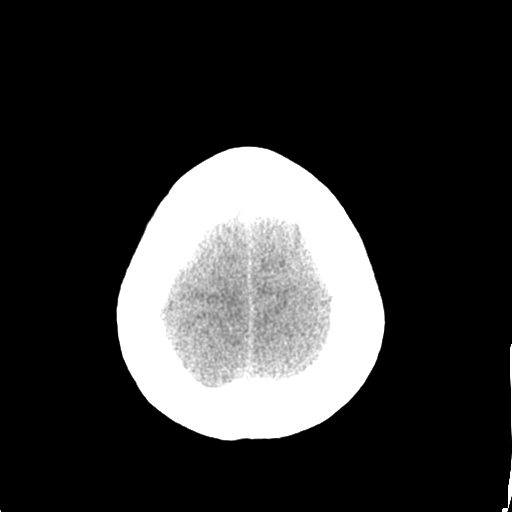
[im 25/31  bone]
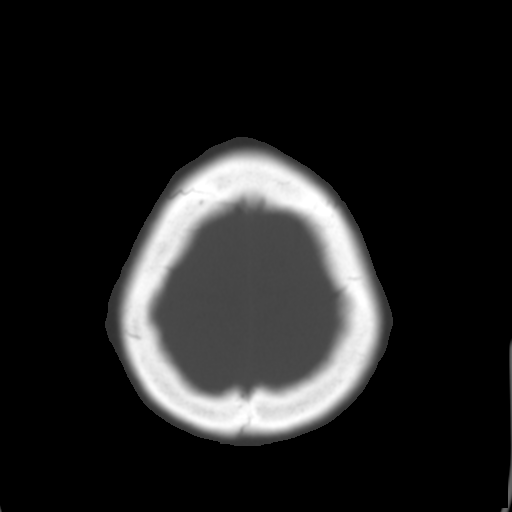
[im 28/31  brain]
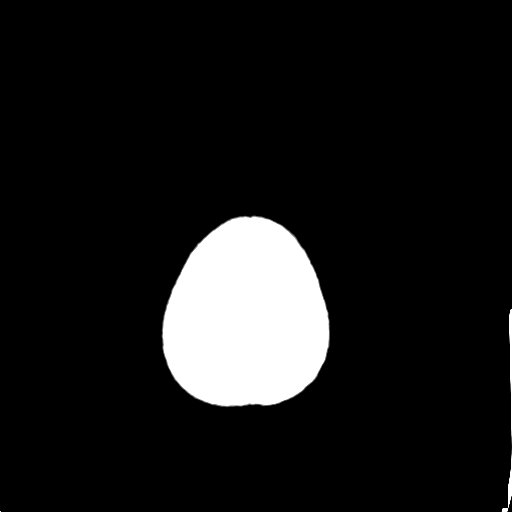

[Series 4: coronal soft · coronal · 0.32mm/px · 3 of 67 slices shown]
[im 23/67  brain]
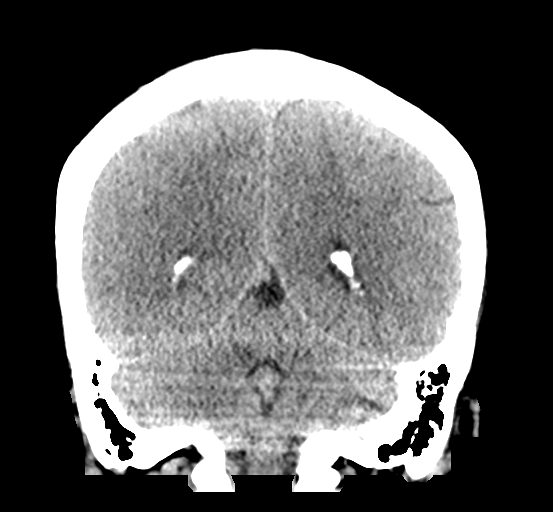
[im 30/67  brain]
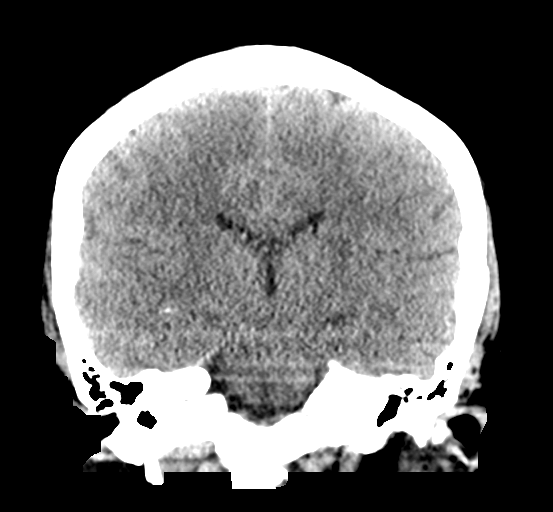
[im 37/67  brain]
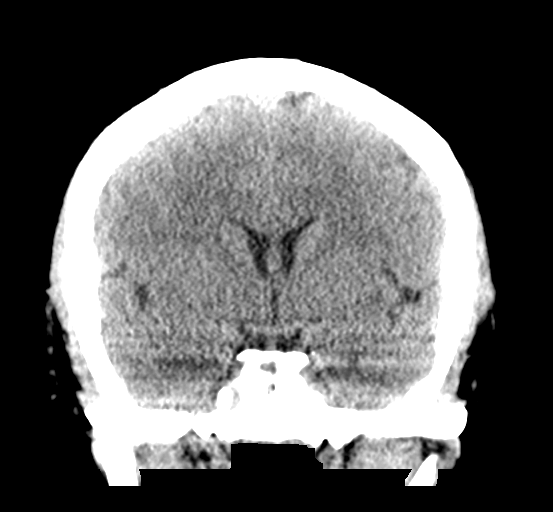

[Series 5: sagittal soft · sagittal · 0.32mm/px · 3 of 60 slices shown]
[im 20/60  brain]
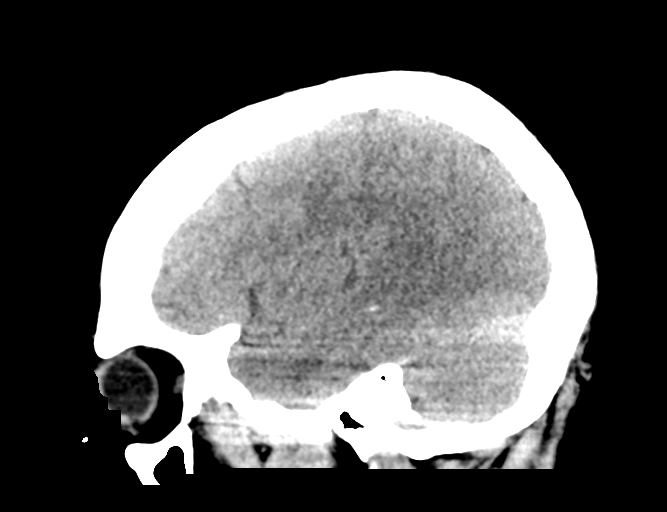
[im 30/60  brain]
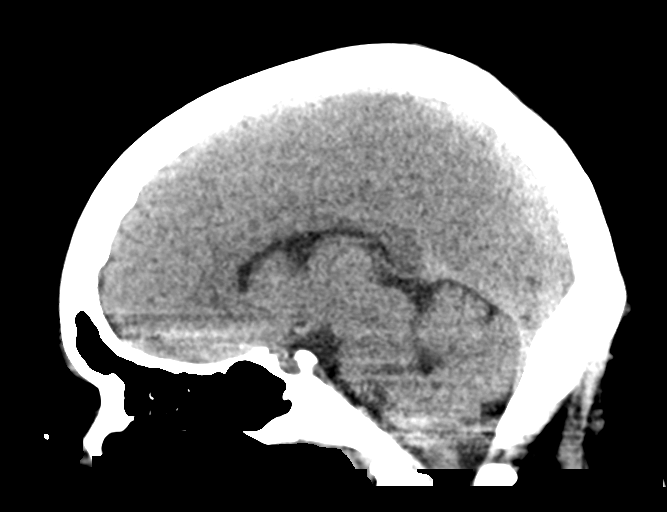
[im 40/60  brain]
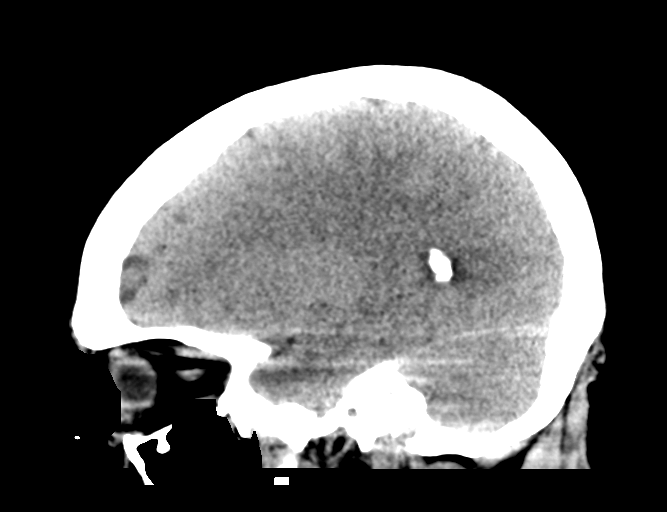

[16 of 47 positions shown; findings below may reference images not displayed]

FINDINGS: Brain: There is no evidence of an acute infarct, intracranial
hemorrhage, mass, midline shift, or extra-axial fluid collection.
The ventricles and sulci are normal.

Vascular: No hyperdense vessel.

Skull: No fracture or suspicious osseous lesion.

Sinuses/Orbits: Visualized paranasal sinuses and mastoid air cells
are clear. Unremarkable orbits.

Other: None.
IMPRESSION: Negative head CT.

## 2022-12-15 IMAGING — DX DG CHEST 1V PORT
1 series · 1 of 1 positions shown · non-contrast
Comparison: None.

CLINICAL DATA: Chest pain.

EXAM:
PORTABLE CHEST 1 VIEW

[chest ap]
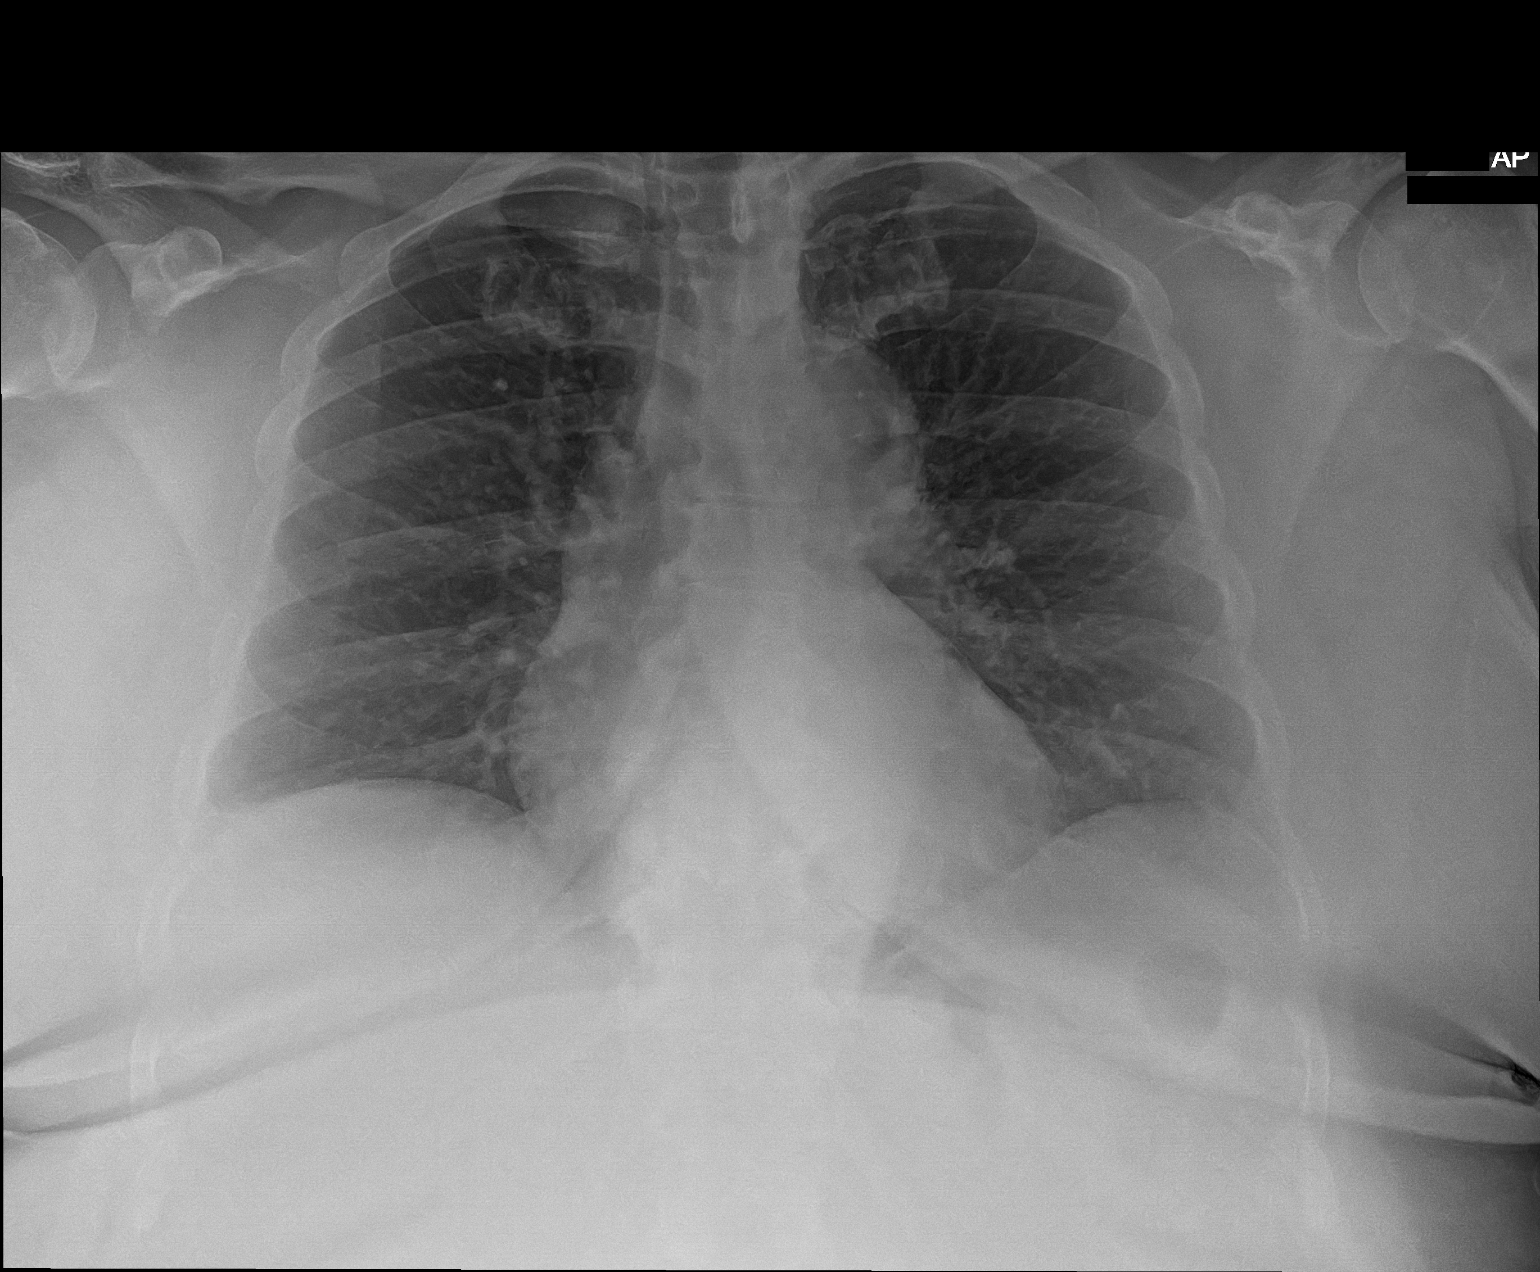

[1 of 1 positions shown; findings below may reference images not displayed]

FINDINGS: The heart size and mediastinal contours are within normal limits.
Both lungs are clear. The visualized skeletal structures are
unremarkable.
IMPRESSION: No active disease.

## 2023-11-12 ENCOUNTER — Encounter (HOSPITAL_COMMUNITY): Payer: Self-pay | Admitting: Emergency Medicine

## 2023-11-12 ENCOUNTER — Emergency Department (HOSPITAL_COMMUNITY): Payer: MEDICAID

## 2023-11-12 ENCOUNTER — Emergency Department (HOSPITAL_COMMUNITY)
Admission: EM | Admit: 2023-11-12 | Discharge: 2023-11-12 | Disposition: A | Discharge status: Home / Self Care | Payer: MEDICAID | Attending: Emergency Medicine | Admitting: Emergency Medicine

## 2023-11-12 ENCOUNTER — Other Ambulatory Visit: Payer: Self-pay

## 2023-11-12 DIAGNOSIS — R0789 Other chest pain: Secondary | ICD-10-CM | POA: Diagnosis not present

## 2023-11-12 DIAGNOSIS — R600 Localized edema: Secondary | ICD-10-CM | POA: Insufficient documentation

## 2023-11-12 DIAGNOSIS — R079 Chest pain, unspecified: Secondary | ICD-10-CM

## 2023-11-12 DIAGNOSIS — I10 Essential (primary) hypertension: Secondary | ICD-10-CM | POA: Diagnosis not present

## 2023-11-12 LAB — BASIC METABOLIC PANEL WITH GFR
Anion gap: 11 (ref 5–15)
BUN: 18 mg/dL (ref 6–20)
CO2: 26 mmol/L (ref 22–32)
Calcium: 9.2 mg/dL (ref 8.9–10.3)
Chloride: 102 mmol/L (ref 98–111)
Creatinine, Ser: 0.73 mg/dL (ref 0.44–1.00)
GFR, Estimated: >60 mL/min (ref >60)
Glucose, Bld: 131 mg/dL — ABNORMAL HIGH (ref 70–99)
Potassium: 4 mmol/L (ref 3.5–5.1)
Sodium: 140 mmol/L (ref 135–145)

## 2023-11-12 LAB — HEPATIC FUNCTION PANEL
ALT: 13 U/L (ref 0–44)
AST: 16 U/L (ref 15–41)
Albumin: 4.1 g/dL (ref 3.5–5.0)
Alkaline Phosphatase: 104 U/L (ref 38–126)
Bilirubin, Direct: <0.1 mg/dL (ref 0.0–0.2)
Total Bilirubin: <0.2 mg/dL (ref 0.0–1.2)
Total Protein: 7.7 g/dL (ref 6.5–8.1)

## 2023-11-12 LAB — D-DIMER, QUANTITATIVE: D-Dimer, Quant: 0.54 ug{FEU}/mL — ABNORMAL HIGH (ref 0.00–0.50)

## 2023-11-12 LAB — URINALYSIS, ROUTINE W REFLEX MICROSCOPIC
Bilirubin Urine: NEGATIVE
Glucose, UA: NEGATIVE mg/dL
Hgb urine dipstick: NEGATIVE
Ketones, ur: NEGATIVE mg/dL
Leukocytes,Ua: NEGATIVE
Nitrite: NEGATIVE
Protein, ur: NEGATIVE mg/dL
Specific Gravity, Urine: 1.015 (ref 1.005–1.030)
pH: 5 (ref 5.0–8.0)

## 2023-11-12 LAB — CBC
HCT: 41.5 % (ref 36.0–46.0)
Hemoglobin: 13.2 g/dL (ref 12.0–15.0)
MCH: 29.7 pg (ref 26.0–34.0)
MCHC: 31.8 g/dL (ref 30.0–36.0)
MCV: 93.5 fL (ref 80.0–100.0)
Platelets: 285 K/uL (ref 150–400)
RBC: 4.44 MIL/uL (ref 3.87–5.11)
RDW: 13.9 % (ref 11.5–15.5)
WBC: 9 K/uL (ref 4.0–10.5)
nRBC: 0 % (ref 0.0–0.2)

## 2023-11-12 LAB — URINE DRUG SCREEN
Amphetamines: NEGATIVE
Barbiturates: NEGATIVE
Benzodiazepines: NEGATIVE
Cocaine: NEGATIVE
Fentanyl: NEGATIVE
Methadone Scn, Ur: NEGATIVE
Opiates: NEGATIVE
Tetrahydrocannabinol: POSITIVE — AB

## 2023-11-12 LAB — TROPONIN T, HIGH SENSITIVITY
Troponin T High Sensitivity: <15 ng/L (ref 0–19)
Troponin T High Sensitivity: <15 ng/L (ref 0–19)

## 2023-11-12 LAB — PRO BRAIN NATRIURETIC PEPTIDE: Pro Brain Natriuretic Peptide: <50 pg/mL (ref <300.0)

## 2023-11-12 LAB — CBG MONITORING, ED: Glucose-Capillary: 125 mg/dL — ABNORMAL HIGH (ref 70–99)

## 2023-11-12 MED ORDER — LIDOCAINE 5 % EX PTCH
1.0000 | MEDICATED_PATCH | CUTANEOUS | Status: DC
  Filled 2023-11-12: qty 1

## 2023-11-12 MED ORDER — ACETAMINOPHEN 500 MG PO TABS
1000.0000 mg | ORAL_TABLET | Freq: Once | ORAL | Status: AC
  Administered 2023-11-12: 1000 mg via ORAL
  Filled 2023-11-12: qty 2

## 2023-11-12 MED ORDER — LIDOCAINE 5 % EX PTCH
1.0000 | MEDICATED_PATCH | CUTANEOUS | Status: DC
  Administered 2023-11-12: 1 via TRANSDERMAL
  Filled 2023-11-12: qty 1

## 2023-11-12 MED ORDER — ONDANSETRON HCL 4 MG/2ML IJ SOLN
4.0000 mg | Freq: Once | INTRAMUSCULAR | Status: AC
  Administered 2023-11-12: 4 mg via INTRAVENOUS
  Filled 2023-11-12: qty 2

## 2023-11-12 MED ORDER — MORPHINE SULFATE (PF) 4 MG/ML IV SOLN
4.0000 mg | Freq: Once | INTRAVENOUS | Status: AC
  Administered 2023-11-12: 4 mg via INTRAVENOUS
  Filled 2023-11-12: qty 1

## 2023-11-12 NOTE — ED Provider Notes (Signed)
  Shared visit with Megan, PA-C.  Patient with chest pain upon waking yesterday. Has not taken any medication for the symptoms. Denies recent infectious symptoms such as fever, cough, nausea, vomiting.   Physical exam showing that chest pain is reproducible to palpation.  Age-adjusted D-dimer within normal limits.  BNP normal.  Troponin within normal limits.  Chest ray without acute cardiopulmonary disease.  Overall, it appears that patient's chest pain is MSK in nature.  Will finish workup here today.   Hoy Nidia FALCON, [place] 11/12/23 1349    Patsey Lot, MD 11/13/23 [phone]

## 2023-11-12 NOTE — Discharge Instructions (Addendum)
 Thank you for visiting the Emergency Department today. It was a pleasure to be part of your healthcare team. Your test results showed no acute findings. You should take your medications as directed. If you have any questions about your medicines, please call your pharmacy or healthcare provider. At home, rest, hydrate, resume normal diet. It is important to watch for warning signs such as worsening pain, fever, trouble breathing. If any of these happen, return to the Emergency Department or call 911. Thank you for trusting us  with your health.  For pain relief:  Alternating between 650 mg Tylenol  and 400 mg Advil: The best way to alternate taking Acetaminophen  (example Tylenol ) and Ibuprofen (example Advil/Motrin) is to take them 3 hours apart. For example, if you take ibuprofen at 6 am you can then take Tylenol  at 9 am. You can continue this regimen throughout the day, making sure you do not exceed the recommended maximum dose for each drug.

## 2023-11-12 NOTE — ED Notes (Signed)
 ED Provider at bedside.

## 2023-11-12 NOTE — ED Provider Notes (Signed)
 [place] EMERGENCY DEPARTMENT AT [facility] Provider Note   CSN: [id number] Arrival date & time: 11/12/23  1151    Patient presents with: Chest Pain   Jaclyn Butler is a 53 y.o. female with a history of schizophrenia presents to the ED with chest pain that began yesterday around 11 PM. The symptoms started suddenly while watching TV in bed and have been stable since onset. The patient describes the symptoms as a soreness in her left chest and arm. Associated symptoms include increased pain with movement.  Patient denies any radiating pain, sweating, shortness of breath, syncope, palpitations, neurological changes such as headache, dizziness, visual disturbances.  Patient states that she has had episodes like this in the past and they have always been diagnosed as muscular chest pain.  Patient denies any falls or trauma to the area.  Patient denies trying any over-the-counter medication for the pain.  No recent travel. No sick contacts. The patient's social history is notable for cocaine drug use. Patient denies any drug use today.      Chest Pain      Prior to Admission medications   Medication Sig Start Date End Date Taking? Authorizing Provider  Accu-Chek Softclix Lancets lancets 4 (four) times daily. 03/16/22   [provider]  atorvastatin  (LIPITOR) 20 MG tablet Take 20 mg by mouth daily. 05/27/23   [provider]  Blood Glucose Monitoring Suppl DEVI May substitute to any manufacturer covered by patient's insurance. 04/14/22   Del Orbe Polanco, Iliana, FNP  Cholecalciferol (VITAMIN D3) 20 MCG (800 UNIT) TABS Take 1 tablet by mouth daily at 6 (six) AM. 07/17/22   Del Wilhelmena Falter, Hilario, FNP  diclofenac  Sodium (VOLTAREN ) 1 % GEL Apply 4 g topically 4 (four) times daily. Patient not taking: Reported on 07/02/2022 03/10/22   Del Orbe Polanco, Iliana, FNP  Dulaglutide  (TRULICITY ) 1.5 MG/0.5ML SOPN Inject 1.5 mg into the skin once a week. 07/17/22   Del Wilhelmena Falter Hilario, FNP  glimepiride  (AMARYL ) 2 MG tablet Take 1 tablet (2 mg total) by mouth daily before breakfast. Patient not taking: Reported on 07/02/2022 04/14/22   Del Orbe Polanco, Iliana, FNP  glucose blood (ACCU-CHEK GUIDE) test strip USE IN THE MORNING, AT NOON, AND AT BEDTIME 07/14/22   Del Wilhelmena Falter, Rosine, FNP  Lancets (FREESTYLE) lancets Use as instructed 04/14/22   Del Wilhelmena Falter Hilario, FNP  lidocaine  (XYLOCAINE ) 5 % ointment Apply 1 Application topically as needed for moderate pain. Patient not taking: Reported on 07/02/2022 03/17/22   Del Orbe Polanco, Iliana, FNP  lisinopril (ZESTRIL) 10 MG tablet Take 10 mg by mouth daily. 10/08/23   [provider]  metFORMIN  (GLUCOPHAGE ) 500 MG tablet Take 500 mg by mouth 2 (two) times daily with a meal. 10/08/23   [provider]  ondansetron  (ZOFRAN -ODT) 4 MG disintegrating tablet Take 4 mg by mouth every 8 (eight) hours as needed for vomiting or nausea. 10/08/23   [provider]  QUEtiapine  (SEROQUEL ) 300 MG tablet Take 300 mg by mouth at bedtime.    [provider]  sertraline  (ZOLOFT ) 50 MG tablet Take 1 tablet (50 mg total) by mouth daily. Patient not taking: Reported on 07/02/2022 03/10/22   Del Wilhelmena Falter Hilario, FNP    Allergies: Naproxen    Review of Systems  Cardiovascular:  Positive for chest pain.    Updated Vital Signs BP (!) 145/96 (BP Location: Left Arm)   Pulse 75   Temp 97.8 F (36.6 C) (  Oral)   Resp 16   Ht 5' 3 (1.6 m)   Wt 102.1 kg   SpO2 98%   BMI 39.86 kg/m   Physical Exam Vitals and nursing note reviewed.  Constitutional:      General: She is not in acute distress.    Appearance: Normal appearance.  HENT:     Head: Normocephalic and atraumatic.  Eyes:     Extraocular Movements: Extraocular movements intact.     Conjunctiva/sclera: Conjunctivae normal.     Pupils: Pupils are equal, round, and reactive to light.  Cardiovascular:     Rate and Rhythm: Normal  rate and regular rhythm.     Pulses: Normal pulses.          Radial pulses are 2+ on the right side.  Pulmonary:     Effort: Pulmonary effort is normal. No respiratory distress.     Comments: Patient is able to speak in full sentences without difficulty. Chest:     Comments: Patient has tenderness to left chest wall, left shoulder, left upper trapezius muscle.  No obvious trauma.  No rashes, no skin changes. Abdominal:     General: Abdomen is flat.     Palpations: Abdomen is soft.     Tenderness: There is no abdominal tenderness.     Comments: No abdominal pain upon palpation.  Musculoskeletal:        General: Normal range of motion.     Cervical back: Full passive range of motion without pain and normal range of motion.     Right lower leg: 1+ Pitting Edema present.     Left lower leg: 1+ Pitting Edema present.  Skin:    General: Skin is warm and dry.     Capillary Refill: Capillary refill takes less than 2 seconds.  Neurological:     General: No focal deficit present.     Mental Status: She is alert. Mental status is at baseline.  Psychiatric:        Mood and Affect: Mood normal.     (all labs ordered are listed, but only abnormal results are displayed) Labs Reviewed  BASIC METABOLIC PANEL WITH GFR - Abnormal; Notable for the following components:      Result Value   Glucose, Bld 131 (*)    All other components within normal limits  D-DIMER, QUANTITATIVE - Abnormal; Notable for the following components:   D-Dimer, Quant 0.54 (*)    All other components within normal limits  URINALYSIS, ROUTINE W REFLEX MICROSCOPIC - Abnormal; Notable for the following components:   Color, Urine STRAW (*)    All other components within normal limits  URINE DRUG SCREEN - Abnormal; Notable for the following components:   Tetrahydrocannabinol POSITIVE (*)    All other components within normal limits  CBG MONITORING, ED - Abnormal; Notable for the following components:   Glucose-Capillary  125 (*)    All other components within normal limits  CBC  HEPATIC FUNCTION PANEL  PRO BRAIN NATRIURETIC PEPTIDE  TROPONIN T, HIGH SENSITIVITY  TROPONIN T, HIGH SENSITIVITY    EKG: EKG Interpretation Date/Time:  Thursday November 12 2023 12:06:14 EDT Ventricular Rate:  83 PR Interval:  169 QRS Duration:  94 QT Interval:  399 QTC Calculation: 469 R Axis:   251  Text Interpretation: Sinus rhythm LAD, consider left anterior fascicular block Abnormal R-wave progression, late transition No significant change since prior 6/24 Confirmed by Towana Sharper [phone]) on 11/12/2023 3:14:10 PM  Radiology: [facility] Chest Portable 1 View  Result Date: 11/12/2023 CLINICAL DATA:  Chest pain beginning yesterday. EXAM: PORTABLE CHEST 1 VIEW COMPARISON:  10/04/2020 FINDINGS: Lungs are hypoinflated and otherwise clear. Cardiomediastinal silhouette and remainder of the exam is unchanged. IMPRESSION: Hypoinflation without acute cardiopulmonary disease. Electronically Signed   By: Toribio Agreste M.D.   On: 11/12/2023 12:28     Procedures   Medications Ordered in the ED  lidocaine  (LIDODERM ) 5 % 1 patch (1 patch Transdermal Patch Applied 11/12/23 1437)  lidocaine  (LIDODERM ) 5 % 1 patch (0 patches Transdermal Hold 11/12/23 1609)  morphine  (PF) 4 MG/ML injection 4 mg (4 mg Intravenous Given 11/12/23 1406)  ondansetron  (ZOFRAN ) injection 4 mg (4 mg Intravenous Given 11/12/23 1405)  acetaminophen  (TYLENOL ) tablet 1,000 mg (1,000 mg Oral Given 11/12/23 1433)                                    Medical Decision Making Amount and/or Complexity of Data Reviewed Labs: ordered. Radiology: ordered.  Risk Prescription drug management.   Patient presents to the ED for concern of chest pain, this involves an extensive number of treatment options, and is a complaint that carries with it a high risk of complications and morbidity.    The differential diagnosis includes: ACS/STEMI/NSTEMI PE Stable  angina MSK  Co morbidities that complicate the patient evaluation: Schizophrenia Hypertension History of illicit drug use  Additional history obtained:  Additional history obtained from Outside Medical Records and Past Admission   External records from outside source obtained and reviewed including medical history, surgical history, allergies, medications.  The patient is a reliable historian, providing a clear, detailed, and consistent account of the presenting symptoms and relevant medical history. The information was obtained directly from the patient and statements were documented in the patient's own words when possible. No discrepancies were noted between the history provided and available collateral sources.     Lab Tests: I ordered, and personally interpreted labs.   The pertinent results include:  CBC WNL BMP WNL POC CBG slightly elevated Urinalysis unremarkable D-Dimer WNL Troponin negative BNP WNL  Imaging Studies ordered: I ordered imaging studies including: DG chest portable 1 view I independently visualized and interpreted imaging which showed: Hypoinflation without acute cardiopulmonary disease. I agree with the radiologist interpretation  Cardiac Monitoring: The patient was maintained on a cardiac monitor.  I personally viewed and interpreted the cardiac monitored which showed an underlying rhythm of: Sinus rhythm LAD  Medicines ordered and prescription drug management: I ordered medications: Morphine  4 mg for pain Acetaminophen  1000 mg for pain Zofran  4 mg for morphine  administration/nausea prevention Lidoderm  patch Reevaluation of the patient after these medicines showed that the patient improved Patient relayed to this provider that her pain completely subsided with pain management treatment at today's visit, patient states that the Lidoderm  patch gave her significant relief I have reviewed the patients home medicines and have made adjustments as  needed  Test Considered: No additional testing was considered based on the patient's presenting symptoms, risk factors, and initial clinical assessment.   The approach to diagnostic testing prioritized exclusion of life-threatening conditions  Problem List / ED Course: Problem List: Acute chest pain Emergency Department Course: The patient presented with acute chest pain x 12 hours. Initial assessment included history, physical exam, and review of prior medical records. ECG and troponin were ordered and reviewed; both were negative for acute ischemia. Chest X-ray was performed to rule out other  causes; no acute findings. CMP, CBC were used to rule out any metabolic or infectious etiologies -both unremarkable.  D-dimer was negative based on patient's age.  UDS was positive for cannabis.  Urinalysis negative.  BNP unremarkable.  Given patient history, physical exam, laboratory findings, imaging, EKG, and patient's improvement with pain medication regimen plan to discharge and treat outpatient for likely MSK chest pain.  Reevaluation: After the interventions noted above, I reevaluated the patient and found that they have :improved  Dispostion: After consideration of the diagnostic results and the patients response to treatment, I feel that the patent would benefit from discharge home for treatment for MSK chest pain with follow-up with PCP for further elevation and care. Clinical Assessment:    Working diagnosis: MSK chest pain Disposition Plan: The patient is medically stable for discharge from the Emergency Department at this time. Vital signs are within normal limits, and the patient is alert, oriented, and in no acute distress. Diagnostic evaluation has been completed with no findings necessitating hospital admission or further emergent workup.  Communication:   Patient and family informed of disposition decision and rationale. Questions addressed.  The diagnostic results and clinical  impression were discussed with the patient and  at bedside and the patient demonstrated understanding.        Final diagnoses:  Chest pain, unspecified type  Chest wall pain    ED Discharge Orders     None          Willma Duwaine CR[place], [place] 11/12/23 2157    Patsey Lot, MD 11/13/23 [phone]

## 2023-11-12 NOTE — ED Notes (Signed)
 Pt ambulated about 100 feet with mild lightheadedness, no SOB, RA sats maintained 96-97% with ambulation

## 2023-11-12 NOTE — ED Triage Notes (Signed)
 Pt c/o of chest pain that started yesterday. Also wants her bipolar meds refilled

## 2023-11-12 NOTE — ED Notes (Signed)
 Attempted IV x1 without success.  Pt states she usually has to have an US  IV

## 2023-11-26 ENCOUNTER — Encounter (INDEPENDENT_AMBULATORY_CARE_PROVIDER_SITE_OTHER): Payer: Self-pay | Admitting: Gastroenterology

## 2024-02-02 ENCOUNTER — Encounter (HOSPITAL_COMMUNITY): Payer: Self-pay

## 2024-02-02 ENCOUNTER — Other Ambulatory Visit: Payer: Self-pay

## 2024-02-02 ENCOUNTER — Emergency Department (HOSPITAL_COMMUNITY)
Admission: EM | Admit: 2024-02-02 | Discharge: 2024-02-02 | Disposition: A | Discharge status: Home / Self Care | Payer: MEDICAID | Attending: Emergency Medicine | Admitting: Emergency Medicine

## 2024-02-02 ENCOUNTER — Emergency Department (HOSPITAL_COMMUNITY): Payer: MEDICAID

## 2024-02-02 DIAGNOSIS — I10 Essential (primary) hypertension: Secondary | ICD-10-CM | POA: Insufficient documentation

## 2024-02-02 DIAGNOSIS — E119 Type 2 diabetes mellitus without complications: Secondary | ICD-10-CM | POA: Diagnosis not present

## 2024-02-02 DIAGNOSIS — Z7984 Long term (current) use of oral hypoglycemic drugs: Secondary | ICD-10-CM | POA: Diagnosis not present

## 2024-02-02 DIAGNOSIS — K5792 Diverticulitis of intestine, part unspecified, without perforation or abscess without bleeding: Secondary | ICD-10-CM | POA: Insufficient documentation

## 2024-02-02 DIAGNOSIS — R519 Headache, unspecified: Secondary | ICD-10-CM | POA: Insufficient documentation

## 2024-02-02 DIAGNOSIS — Z79899 Other long term (current) drug therapy: Secondary | ICD-10-CM | POA: Diagnosis not present

## 2024-02-02 DIAGNOSIS — R1032 Left lower quadrant pain: Secondary | ICD-10-CM | POA: Diagnosis present

## 2024-02-02 HISTORY — DX: Diverticulitis of intestine, part unspecified, without perforation or abscess without bleeding: K57.92

## 2024-02-02 LAB — CBC
HCT: 39.4 % (ref 36.0–46.0)
Hemoglobin: 12.4 g/dL (ref 12.0–15.0)
MCH: 29.5 pg (ref 26.0–34.0)
MCHC: 31.5 g/dL (ref 30.0–36.0)
MCV: 93.8 fL (ref 80.0–100.0)
Platelets: 295 K/uL (ref 150–400)
RBC: 4.2 MIL/uL (ref 3.87–5.11)
RDW: 14.6 % (ref 11.5–15.5)
WBC: 7.1 K/uL (ref 4.0–10.5)
nRBC: 0 % (ref 0.0–0.2)

## 2024-02-02 LAB — COMPREHENSIVE METABOLIC PANEL WITH GFR
ALT: 10 U/L (ref 0–44)
AST: 15 U/L (ref 15–41)
Albumin: 4.2 g/dL (ref 3.5–5.0)
Alkaline Phosphatase: 119 U/L (ref 38–126)
Anion gap: 15 (ref 5–15)
BUN: 14 mg/dL (ref 6–20)
CO2: 21 mmol/L — ABNORMAL LOW (ref 22–32)
Calcium: 9 mg/dL (ref 8.9–10.3)
Chloride: 104 mmol/L (ref 98–111)
Creatinine, Ser: 0.76 mg/dL (ref 0.44–1.00)
GFR, Estimated: >60 mL/min
Glucose, Bld: 206 mg/dL — ABNORMAL HIGH (ref 70–99)
Potassium: 4 mmol/L (ref 3.5–5.1)
Sodium: 141 mmol/L (ref 135–145)
Total Bilirubin: <0.2 mg/dL (ref 0.0–1.2)
Total Protein: 7.5 g/dL (ref 6.5–8.1)

## 2024-02-02 LAB — URINALYSIS, ROUTINE W REFLEX MICROSCOPIC
Bilirubin Urine: NEGATIVE
Glucose, UA: 150 mg/dL — AB
Hgb urine dipstick: NEGATIVE
Ketones, ur: NEGATIVE mg/dL
Leukocytes,Ua: NEGATIVE
Nitrite: NEGATIVE
Protein, ur: NEGATIVE mg/dL
Specific Gravity, Urine: 1.031 — ABNORMAL HIGH (ref 1.005–1.030)
pH: 5 (ref 5.0–8.0)

## 2024-02-02 LAB — LIPASE, BLOOD: Lipase: 50 U/L (ref 11–51)

## 2024-02-02 MED ORDER — DIPHENHYDRAMINE HCL 50 MG/ML IJ SOLN
12.5000 mg | Freq: Once | INTRAMUSCULAR | Status: AC
  Administered 2024-02-02: 12.5 mg via INTRAVENOUS
  Filled 2024-02-02: qty 1

## 2024-02-02 MED ORDER — IOHEXOL 350 MG/ML SOLN
100.0000 mL | Freq: Once | INTRAVENOUS | Status: AC | PRN
  Administered 2024-02-02: 100 mL via INTRAVENOUS

## 2024-02-02 MED ORDER — DEXAMETHASONE SOD PHOSPHATE PF 10 MG/ML IJ SOLN
10.0000 mg | Freq: Once | INTRAMUSCULAR | Status: AC
  Administered 2024-02-02: 10 mg via INTRAVENOUS
  Filled 2024-02-02: qty 1

## 2024-02-02 MED ORDER — METOCLOPRAMIDE HCL 5 MG/ML IJ SOLN
10.0000 mg | Freq: Once | INTRAMUSCULAR | Status: AC
  Administered 2024-02-02: 10 mg via INTRAVENOUS
  Filled 2024-02-02: qty 2

## 2024-02-02 MED ORDER — AMOXICILLIN-POT CLAVULANATE 875-125 MG PO TABS: 1.0000 | ORAL_TABLET | Freq: Two times a day (BID) | ORAL | 0 refills | Status: AC

## 2024-02-02 MED ORDER — SODIUM CHLORIDE 0.9 % IV BOLUS
1000.0000 mL | Freq: Once | INTRAVENOUS | Status: AC
  Administered 2024-02-02: 1000 mL via INTRAVENOUS

## 2024-02-02 MED ORDER — AMOXICILLIN-POT CLAVULANATE 875-125 MG PO TABS
1.0000 | ORAL_TABLET | Freq: Once | ORAL | Status: AC
  Administered 2024-02-02: 1 via ORAL
  Filled 2024-02-02: qty 1

## 2024-02-02 NOTE — ED Triage Notes (Signed)
 Pt arrived via POV c/o left flank pain and endorses severe headache. Pt also reports running out of her psyche meds X 1 week for her bipolar and schizophrenia.

## 2024-02-02 NOTE — ED Provider Notes (Signed)
 " [place] EMERGENCY DEPARTMENT AT [facility] Provider Note   CSN: [id number] Arrival date & time: 02/02/24  1545     Patient presents with: Abdominal Pain   Petrea Fredenburg is a 54 y.o. female.   Patient with history of schizophrenia, T2DM, HTN, diverticulitis presents for evaluation of severe headache, abdominal pain. She reports she had a headache when she woke this morning that became worse a short time later and has been constant and severe through the rest of the day. No history of migraine or similar headache. She describes the headache as most prominent across bilateral frontal area but pain is global. It is associated with photosensitivity, and without nausea or vomiting. She took Tylenol  and Motrin without relief. She reports her abdominal pain is located in the LLQ abdomen and started 3 days ago. No fever. She has not had a bowel movement since it began. She has a history of diverticulitis that felt similar. No urinary symptoms.   The history is provided by the patient. No language interpreter was used.  Abdominal Pain      Prior to Admission medications  Medication Sig Start Date End Date Taking? Authorizing Provider  amoxicillin -clavulanate (AUGMENTIN ) 875-125 MG tablet Take 1 tablet by mouth every 12 (twelve) hours. 02/02/24  Yes Odell Balls, PA-C  Accu-Chek Softclix Lancets lancets 4 (four) times daily. 03/16/22   [provider]  atorvastatin  (LIPITOR) 20 MG tablet Take 20 mg by mouth daily. 05/27/23   [provider]  Blood Glucose Monitoring Suppl DEVI May substitute to any manufacturer covered by patient's insurance. 04/14/22   Del Orbe Polanco, Iliana, FNP  Cholecalciferol (VITAMIN D3) 20 MCG (800 UNIT) TABS Take 1 tablet by mouth daily at 6 (six) AM. 07/17/22   Del Wilhelmena Falter, Hilario, FNP  diclofenac  Sodium (VOLTAREN ) 1 % GEL Apply 4 g topically 4 (four) times daily. Patient not taking: Reported on 07/02/2022 03/10/22   Del Orbe Polanco,  Iliana, FNP  Dulaglutide  (TRULICITY ) 1.5 MG/0.5ML SOPN Inject 1.5 mg into the skin once a week. 07/17/22   Del Wilhelmena Falter Hilario, FNP  glimepiride  (AMARYL ) 2 MG tablet Take 1 tablet (2 mg total) by mouth daily before breakfast. Patient not taking: Reported on 07/02/2022 04/14/22   Del Orbe Polanco, Iliana, FNP  glucose blood (ACCU-CHEK GUIDE) test strip USE IN THE MORNING, AT NOON, AND AT BEDTIME 07/14/22   Del Wilhelmena Falter, [place], FNP  Lancets (FREESTYLE) lancets Use as instructed 04/14/22   Del Wilhelmena Falter Hilario, FNP  lidocaine  (XYLOCAINE ) 5 % ointment Apply 1 Application topically as needed for moderate pain. Patient not taking: Reported on 07/02/2022 03/17/22   Del Orbe Polanco, Iliana, FNP  lisinopril (ZESTRIL) 10 MG tablet Take 10 mg by mouth daily. 10/08/23   [provider]  metFORMIN  (GLUCOPHAGE ) 500 MG tablet Take 500 mg by mouth 2 (two) times daily with a meal. 10/08/23   [provider]  ondansetron  (ZOFRAN -ODT) 4 MG disintegrating tablet Take 4 mg by mouth every 8 (eight) hours as needed for vomiting or nausea. 10/08/23   [provider]  QUEtiapine  (SEROQUEL ) 300 MG tablet Take 300 mg by mouth at bedtime.    [provider]  sertraline  (ZOLOFT ) 50 MG tablet Take 1 tablet (50 mg total) by mouth daily. Patient not taking: Reported on 07/02/2022 03/10/22   Del Wilhelmena Falter Hilario, FNP    Allergies: Naproxen    Review of Systems  Gastrointestinal:  Positive for abdominal pain.    Updated Vital  Signs BP (!) 135/90 (BP Location: Right Wrist)   Pulse 81   Temp 98.8 F (37.1 C) (Oral)   Resp 20   Ht 5' 3 (1.6 m)   Wt 102.1 kg   SpO2 96%   BMI 39.87 kg/m   Physical Exam Constitutional:      Appearance: She is well-developed.  HENT:     Head: Normocephalic.  Cardiovascular:     Rate and Rhythm: Normal rate and regular rhythm.     Heart sounds: No murmur heard. Pulmonary:     Effort: Pulmonary effort is normal.     Breath sounds: Normal  breath sounds. No wheezing, rhonchi or rales.  Abdominal:     General: Bowel sounds are normal.     Palpations: Abdomen is soft.     Tenderness: There is abdominal tenderness in the left lower quadrant. There is left CVA tenderness and guarding. There is no rebound.  Musculoskeletal:        General: Normal range of motion.     Cervical back: Normal range of motion and neck supple.  Skin:    General: Skin is warm and dry.  Neurological:     General: No focal deficit present.     Mental Status: She is alert and oriented to person, place, and time.     GCS: GCS eye subscore is 4. GCS verbal subscore is 5. GCS motor subscore is 6.     Cranial Nerves: Cranial nerves 2-12 are intact. No dysarthria or facial asymmetry.     Sensory: Sensation is intact.     Motor: No weakness or pronator drift.     Coordination: Coordination normal.     (all labs ordered are listed, but only abnormal results are displayed) Labs Reviewed  COMPREHENSIVE METABOLIC PANEL WITH GFR - Abnormal; Notable for the following components:      Result Value   CO2 21 (*)    Glucose, Bld 206 (*)    All other components within normal limits  URINALYSIS, ROUTINE W REFLEX MICROSCOPIC - Abnormal; Notable for the following components:   Specific Gravity, Urine 1.031 (*)    Glucose, UA 150 (*)    All other components within normal limits  LIPASE, BLOOD  CBC    EKG: None  Radiology: No results found.   Procedures   Medications Ordered in the ED  sodium chloride  0.9 % bolus 1,000 mL (0 mLs Intravenous Stopped 02/02/24 1905)  metoCLOPramide  (REGLAN ) injection 10 mg (10 mg Intravenous Given 02/02/24 1729)  diphenhydrAMINE  (BENADRYL ) injection 12.5 mg (12.5 mg Intravenous Given 02/02/24 1728)  dexamethasone  (DECADRON ) injection 10 mg (10 mg Intravenous Given 02/02/24 1730)  iohexol  (OMNIPAQUE ) 350 MG/ML injection 100 mL (100 mLs Intravenous Contrast Given 02/02/24 1952)  amoxicillin -clavulanate (AUGMENTIN ) 875-125 MG per  tablet 1 tablet (1 tablet Oral Given 02/02/24 2107)    Clinical Course as of 02/05/24 0101  Tue Feb 02, 2024  1743 Patient with severe headache, gradual onset but no history of headaches. Migraine cocktail provided for relief. Neurologic exam has no deficits. Concerning HA in its severity. CTA head ordered.   Abd pain in LLQ, h/o diverticulitis. CT abd/pel ordered and is pending.    Labs reassuring. No leukocytosis, normal chemistries, normal renal function.  [SU]  1857 Recheck - headache is completely resolved. CTA Head and neck per radiology:   IMPRESSION: 1. Negative CTA of the head. No large vessel occlusion, hemodynamically significant stenosis, or other acute vascular abnormality. No aneurysm. 2. No other acute intracranial  abnormality.  CT abd/pel per radiology:   IMPRESSION: 1. Mild acute diverticulitis of the distal descending/sigmoid colon. No abscess or perforation. 2. Fatty liver.   [SU]  2119 Headache resolved. CT's of head and abd showing only uncomplicated diverticulitis. Augmentin  started. REturn precautions discussed. Patient comfortable with discharge home.  [SU]    Clinical Course User Index [SU] Odell Balls, PA-C                                 Medical Decision Making Amount and/or Complexity of Data Reviewed Labs: ordered. Radiology: ordered.  Risk Prescription drug management.        Final diagnoses:  Acute nonintractable headache, unspecified headache type  Diverticulitis    ED Discharge Orders          Ordered    amoxicillin -clavulanate (AUGMENTIN ) 875-125 MG tablet  Every 12 hours        02/02/24 2100               Odell Balls, PA-C 02/05/24 0101  "

## 2024-02-02 NOTE — Discharge Instructions (Signed)
 As we discussed, you do have diverticulitis on the CT scan but there is no sign of complication. Take Augmentin  for infection as prescribed.   Please plan to see your doctor this week for recheck. REturn to the ED with any high fever, severe pain, or new concern.
